# Patient Record
Sex: Female | Born: 1940
Health system: Southern US, Community
[De-identification: ages and names within clinical notes are randomized; demographics above are authoritative.]

## PROBLEM LIST (undated history)

## (undated) DIAGNOSIS — I10 Essential (primary) hypertension: Secondary | ICD-10-CM

## (undated) DIAGNOSIS — F329 Major depressive disorder, single episode, unspecified: Secondary | ICD-10-CM

## (undated) DIAGNOSIS — F32A Depression, unspecified: Secondary | ICD-10-CM

## (undated) DIAGNOSIS — F419 Anxiety disorder, unspecified: Secondary | ICD-10-CM

## (undated) HISTORY — PX: TONSILLECTOMY: SUR1361

---

## 2005-01-19 ENCOUNTER — Encounter: Admission: RE | Admit: 2005-01-19 | Discharge: 2005-01-19 | Payer: Self-pay | Admitting: Geriatric Medicine

## 2007-08-28 ENCOUNTER — Other Ambulatory Visit: Admission: RE | Admit: 2007-08-28 | Discharge: 2007-08-28 | Payer: Self-pay | Admitting: Geriatric Medicine

## 2008-09-02 ENCOUNTER — Other Ambulatory Visit: Admission: RE | Admit: 2008-09-02 | Discharge: 2008-09-02 | Payer: Self-pay | Admitting: Geriatric Medicine

## 2008-09-16 ENCOUNTER — Encounter: Admission: RE | Admit: 2008-09-16 | Discharge: 2008-09-16 | Payer: Self-pay | Admitting: Geriatric Medicine

## 2009-10-08 ENCOUNTER — Other Ambulatory Visit: Admission: RE | Admit: 2009-10-08 | Discharge: 2009-10-08 | Payer: Self-pay | Admitting: Geriatric Medicine

## 2010-11-28 ENCOUNTER — Encounter: Payer: Self-pay | Admitting: Geriatric Medicine

## 2011-10-24 ENCOUNTER — Encounter: Payer: Self-pay | Admitting: *Deleted

## 2011-10-24 ENCOUNTER — Other Ambulatory Visit (HOSPITAL_COMMUNITY): Payer: Medicare Other

## 2011-10-24 ENCOUNTER — Emergency Department (HOSPITAL_COMMUNITY): Payer: Medicare Other

## 2011-10-24 ENCOUNTER — Emergency Department (HOSPITAL_COMMUNITY)
Admission: EM | Admit: 2011-10-24 | Discharge: 2011-10-24 | Disposition: A | Discharge status: Home / Self Care | Payer: Medicare Other | Attending: Emergency Medicine | Admitting: Emergency Medicine

## 2011-10-24 DIAGNOSIS — W010XXA Fall on same level from slipping, tripping and stumbling without subsequent striking against object, initial encounter: Secondary | ICD-10-CM | POA: Insufficient documentation

## 2011-10-24 DIAGNOSIS — Z79899 Other long term (current) drug therapy: Secondary | ICD-10-CM | POA: Insufficient documentation

## 2011-10-24 DIAGNOSIS — S82009A Unspecified fracture of unspecified patella, initial encounter for closed fracture: Secondary | ICD-10-CM | POA: Insufficient documentation

## 2011-10-24 DIAGNOSIS — F341 Dysthymic disorder: Secondary | ICD-10-CM | POA: Insufficient documentation

## 2011-10-24 DIAGNOSIS — M25569 Pain in unspecified knee: Secondary | ICD-10-CM | POA: Insufficient documentation

## 2011-10-24 DIAGNOSIS — IMO0002 Reserved for concepts with insufficient information to code with codable children: Secondary | ICD-10-CM | POA: Insufficient documentation

## 2011-10-24 DIAGNOSIS — E119 Type 2 diabetes mellitus without complications: Secondary | ICD-10-CM | POA: Insufficient documentation

## 2011-10-24 DIAGNOSIS — I10 Essential (primary) hypertension: Secondary | ICD-10-CM | POA: Insufficient documentation

## 2011-10-24 HISTORY — DX: Essential (primary) hypertension: I10

## 2011-10-24 HISTORY — DX: Anxiety disorder, unspecified: F41.9

## 2011-10-24 HISTORY — DX: Major depressive disorder, single episode, unspecified: F32.9

## 2011-10-24 HISTORY — DX: Depression, unspecified: F32.A

## 2011-10-24 NOTE — ED Notes (Signed)
Per EMS, staff called from [facility] after pt's tennis shoes "got stuck" on the carpet causing pt to fall on R knee, pt observed walking on knee by EMS after fall.

## 2011-10-24 NOTE — ED Notes (Signed)
Patient returned from X-ray 

## 2011-10-24 NOTE — ED Notes (Signed)
EAV:[id number]<JX> Expected date:10/24/11<BR> Expected time: 7:36 AM<BR> Means of arrival:<BR> Comments:<BR> EMS, Fall-knee injury

## 2011-10-24 NOTE — ED Notes (Addendum)
[facility] called and report given to Elease Hashimoto, Merchandiser, retail.

## 2011-10-24 NOTE — ED Notes (Signed)
Patient transported to X-ray 

## 2011-10-24 NOTE — ED Provider Notes (Signed)
History     CSN: [id number] Arrival date & time: 10/24/2011  7:55 AM   Chief Complaint  Patient presents with  . Fall   HPI Pt was seen at 0810.  Per pt, c/o sudden onset and resolution of one episode of trip and fall PTA.  Pt states she fell onto her right knee.  Was ambulatory after the fall.  Denies hitting head, no LOC, no neck or back pain, no CP/SOB, no abd pain, no prodromal symptoms before fall, no focal motor weakness, no tingling/numbness in extremities.   Past Medical History  Diagnosis Date  . Diabetes mellitus   . Hypertension   . Anxiety   . Depression     Past Surgical History  Procedure Date  . Tonsillectomy    History  Substance Use Topics  . Smoking status: Never Smoker   . Smokeless tobacco: Never Used  . Alcohol Use: No    Review of Systems ROS: Statement: All systems negative except as marked or noted in the HPI; Constitutional: Negative for fever and chills. ; ; Eyes: Negative for eye pain, redness and discharge. ; ; ENMT: Negative for ear pain, hoarseness, nasal congestion, sinus pressure and sore throat. ; ; Cardiovascular: Negative for chest pain, palpitations, diaphoresis, dyspnea and peripheral edema. ; ; Respiratory: Negative for cough, wheezing and stridor. ; ; Gastrointestinal: Negative for nausea, vomiting, diarrhea, abdominal pain, blood in stool, hematemesis, jaundice and rectal bleeding. . ; ; Genitourinary: Negative for dysuria, flank pain and hematuria. ; ; Musculoskeletal: Negative for back pain and neck pain. +right knee pain ; Skin: +superficial abrasion. Negative for pruritus, rash, blisters, bruising and skin lesion.; ; Neuro: Negative for headache, lightheadedness and neck stiffness. Negative for weakness, altered level of consciousness , altered mental status, extremity weakness, paresthesias, involuntary movement, seizure and syncope.     Allergies  Review of patient's allergies indicates no known allergies.  Home Medications    Current Outpatient Rx  Name Route Sig Dispense Refill  . ENALAPRIL MALEATE 5 MG PO TABS Oral Take 5 mg by mouth daily.      . FUROSEMIDE 20 MG PO TABS Oral Take 20 mg by mouth daily.      Marland Kitchen LABETALOL HCL 100 MG PO TABS Oral Take 100 mg by mouth 2 (two) times daily.      Marland Kitchen METFORMIN HCL 850 MG PO TABS Oral Take 850 mg by mouth 2 (two) times daily with a meal.      . SIMVASTATIN 20 MG PO TABS Oral Take 20 mg by mouth 2 (two) times daily.        BP 130/75  Pulse 83  Temp(Src) 99 F (37.2 C) (Oral)  Resp 16  SpO2 98%  Physical Exam 0815: Physical examination:  Nursing notes reviewed; Vital signs and O2 SAT reviewed;  Constitutional: Well developed, Well nourished, Well hydrated, In no acute distress; Head:  Normocephalic, atraumatic; Eyes: EOMI, PERRL, No scleral icterus; ENMT: Mouth and pharynx normal, Mucous membranes moist; Neck: Supple, Full range of motion, No lymphadenopathy; Cardiovascular: Regular rate and rhythm, No murmur, rub, or gallop; Respiratory: Breath sounds clear & equal bilaterally, No rales, rhonchi, wheezes, or rub, Normal respiratory effort/excursion; Chest: Nontender, Movement normal; Abdomen: Soft, Nontender, Nondistended, Normal bowel sounds; Spine:  No midline CS, TS, LS tenderness. Extremities: Pulses normal, Pelvis stable. +right knee with small superficial abrasion to patella area with localized faint ecchymosis and edema, no deformity, +decreased ROM (F/E) right knee due to increasing pain, pt is able  to lift extended RLE off stretcher, and extend right lower leg from flexion.  No ligamentous laxity.  No palpable patellar or quad tendon step-offs.  NMS intact right foot, strong pedal pp.  No proximal fibular head tenderness.  No specific area of point tenderness. RLE muscle compartments soft. No calf edema or asymmetry.; Neuro: AA&Ox3, Major CN grossly intact. Speech clear, no facial droop.  No gross focal motor or sensory deficits in extremities.; Skin: Color normal,  Warm, Dry.   ED Course  Procedures  MDM  MDM Reviewed: vitals, nursing note and previous chart Interpretation: x-ray   Ct Knee Right Wo Contrast  10/24/2011  *RADIOLOGY REPORT*  Clinical Data: Evaluate for knee fracture  CT OF THE RIGHT KNEE WITHOUT CONTRAST  Technique:  Multidetector CT imaging was performed according to the standard protocol. Multiplanar CT image reconstructions were also generated.  Comparison: 10/24/2011  Findings:  There is a nondisplaced fracture which extends through the superolateral margin of the patella.  There are no additional fractures or subluxations identified.  A moderate sized suprapatellar joint effusion is identified.  There are mild degenerative changes involving the knee joint including sharpening of the tibial spines.  There are no radio-opaque foreign body or soft tissue calcifications.  IMPRESSION:  1. Nondisplaced fracture involving the superior lateral margin of the patella.  Original Report Authenticated By: Rosealee Albee, M.D.   Dg Knee Complete 4 Views Right  10/24/2011  *RADIOLOGY REPORT*  Clinical Data: Post fall, now with knee pain  RIGHT KNEE - COMPLETE 4+ VIEW  Comparison: None.  Findings: There is a serpiginous lucency within the superior lateral aspect of the patella.  No dislocation.  Small knee joint effusion.  There is minimal degenerative changes of the medial compartment of patellofemoral joint with joint space loss and osteophytosis.  No definite evidence of chondrocalcinosis. Vascular calcifications.  IMPRESSION:  Small joint effusion with serpiginous lucency within the superior lateral aspect of the patella which may represent either a nondisplaced avulsion fracture versus an unfused apophysis. Correlation for point tenderness at this location is recommended.  Original Report Authenticated By: Waynard Reeds, M.D.    1045:  T/C to Ortho Dr. Turner Daniels, case discussed, including:  HPI, pertinent PM/SHx, VS/PE, dx testing, ED course and  treatment.  Has reviewed the XR/CT images.  Agreeable to f/u in ofc in the next 1-2 weeks.  Requests to place in knee immobilizer, walker with WBAT.   10:48 AM:   Knee immobilizer placed, given walker.  Dx testing d/w pt and family.  Questions answered.  Verb understanding, agreeable to d/c home with outpt f/u.   Daja Shuping Allison Quarry, DO 10/24/11 2026

## 2011-10-24 NOTE — ED Notes (Signed)
Dr. McMannus at bedside 

## 2014-01-30 ENCOUNTER — Other Ambulatory Visit: Payer: Self-pay

## 2014-01-30 DIAGNOSIS — Z1231 Encounter for screening mammogram for malignant neoplasm of breast: Secondary | ICD-10-CM

## 2014-02-27 ENCOUNTER — Ambulatory Visit
Admission: RE | Admit: 2014-02-27 | Discharge: 2014-02-27 | Disposition: A | Discharge status: Home / Self Care | Payer: Self-pay | Source: Ambulatory Visit

## 2014-02-27 ENCOUNTER — Encounter (INDEPENDENT_AMBULATORY_CARE_PROVIDER_SITE_OTHER): Payer: Self-pay

## 2014-02-27 DIAGNOSIS — Z1231 Encounter for screening mammogram for malignant neoplasm of breast: Secondary | ICD-10-CM

## 2014-05-24 ENCOUNTER — Other Ambulatory Visit: Payer: Self-pay | Admitting: Internal Medicine

## 2014-05-24 ENCOUNTER — Ambulatory Visit
Admission: RE | Admit: 2014-05-24 | Discharge: 2014-05-24 | Disposition: A | Discharge status: Home / Self Care | Payer: Medicaid Other | Source: Ambulatory Visit | Attending: Internal Medicine | Admitting: Internal Medicine

## 2014-05-24 DIAGNOSIS — M7989 Other specified soft tissue disorders: Secondary | ICD-10-CM

## 2015-01-27 ENCOUNTER — Other Ambulatory Visit: Payer: Self-pay | Admitting: Geriatric Medicine

## 2015-01-27 DIAGNOSIS — Z1231 Encounter for screening mammogram for malignant neoplasm of breast: Secondary | ICD-10-CM

## 2015-03-12 ENCOUNTER — Ambulatory Visit: Payer: Medicaid Other

## 2015-03-19 ENCOUNTER — Ambulatory Visit
Admission: RE | Admit: 2015-03-19 | Discharge: 2015-03-19 | Disposition: A | Discharge status: Home / Self Care | Payer: Medicare Other | Source: Ambulatory Visit | Attending: Geriatric Medicine | Admitting: Geriatric Medicine

## 2015-03-19 DIAGNOSIS — Z1231 Encounter for screening mammogram for malignant neoplasm of breast: Secondary | ICD-10-CM

## 2016-02-11 DIAGNOSIS — N183 Chronic kidney disease, stage 3 (moderate): Secondary | ICD-10-CM | POA: Diagnosis not present

## 2016-02-11 DIAGNOSIS — Z7984 Long term (current) use of oral hypoglycemic drugs: Secondary | ICD-10-CM | POA: Diagnosis not present

## 2016-02-11 DIAGNOSIS — Z0000 Encounter for general adult medical examination without abnormal findings: Secondary | ICD-10-CM | POA: Diagnosis not present

## 2016-02-11 DIAGNOSIS — I10 Essential (primary) hypertension: Secondary | ICD-10-CM | POA: Diagnosis not present

## 2016-02-11 DIAGNOSIS — Z1389 Encounter for screening for other disorder: Secondary | ICD-10-CM | POA: Diagnosis not present

## 2016-02-11 DIAGNOSIS — E7800 Pure hypercholesterolemia, unspecified: Secondary | ICD-10-CM | POA: Diagnosis not present

## 2016-02-11 DIAGNOSIS — Z79899 Other long term (current) drug therapy: Secondary | ICD-10-CM | POA: Diagnosis not present

## 2016-02-11 DIAGNOSIS — E1121 Type 2 diabetes mellitus with diabetic nephropathy: Secondary | ICD-10-CM | POA: Diagnosis not present

## 2016-02-11 DIAGNOSIS — F325 Major depressive disorder, single episode, in full remission: Secondary | ICD-10-CM | POA: Diagnosis not present

## 2016-02-11 DIAGNOSIS — M810 Age-related osteoporosis without current pathological fracture: Secondary | ICD-10-CM | POA: Diagnosis not present

## 2016-02-12 ENCOUNTER — Other Ambulatory Visit: Payer: Self-pay | Admitting: Geriatric Medicine

## 2016-02-12 DIAGNOSIS — Z1231 Encounter for screening mammogram for malignant neoplasm of breast: Secondary | ICD-10-CM

## 2016-03-24 ENCOUNTER — Ambulatory Visit
Admission: RE | Admit: 2016-03-24 | Discharge: 2016-03-24 | Disposition: A | Discharge status: Home / Self Care | Payer: Medicare Other | Source: Ambulatory Visit | Attending: Geriatric Medicine | Admitting: Geriatric Medicine

## 2016-03-24 DIAGNOSIS — Z1231 Encounter for screening mammogram for malignant neoplasm of breast: Secondary | ICD-10-CM

## 2016-07-28 DIAGNOSIS — E119 Type 2 diabetes mellitus without complications: Secondary | ICD-10-CM | POA: Diagnosis not present

## 2016-07-28 DIAGNOSIS — H2513 Age-related nuclear cataract, bilateral: Secondary | ICD-10-CM | POA: Diagnosis not present

## 2016-08-11 DIAGNOSIS — I10 Essential (primary) hypertension: Secondary | ICD-10-CM | POA: Diagnosis not present

## 2016-08-11 DIAGNOSIS — Z7984 Long term (current) use of oral hypoglycemic drugs: Secondary | ICD-10-CM | POA: Diagnosis not present

## 2016-08-11 DIAGNOSIS — Z79899 Other long term (current) drug therapy: Secondary | ICD-10-CM | POA: Diagnosis not present

## 2016-08-11 DIAGNOSIS — E119 Type 2 diabetes mellitus without complications: Secondary | ICD-10-CM | POA: Diagnosis not present

## 2016-08-11 DIAGNOSIS — Z23 Encounter for immunization: Secondary | ICD-10-CM | POA: Diagnosis not present

## 2017-01-05 ENCOUNTER — Other Ambulatory Visit: Payer: Self-pay | Admitting: Geriatric Medicine

## 2017-01-05 DIAGNOSIS — Z1231 Encounter for screening mammogram for malignant neoplasm of breast: Secondary | ICD-10-CM

## 2017-02-09 DIAGNOSIS — I10 Essential (primary) hypertension: Secondary | ICD-10-CM | POA: Diagnosis not present

## 2017-02-09 DIAGNOSIS — Z79899 Other long term (current) drug therapy: Secondary | ICD-10-CM | POA: Diagnosis not present

## 2017-02-09 DIAGNOSIS — E119 Type 2 diabetes mellitus without complications: Secondary | ICD-10-CM | POA: Diagnosis not present

## 2017-02-09 DIAGNOSIS — E7800 Pure hypercholesterolemia, unspecified: Secondary | ICD-10-CM | POA: Diagnosis not present

## 2017-03-28 ENCOUNTER — Ambulatory Visit
Admission: RE | Admit: 2017-03-28 | Discharge: 2017-03-28 | Disposition: A | Discharge status: Home / Self Care | Payer: Medicare Other | Source: Ambulatory Visit | Attending: Geriatric Medicine | Admitting: Geriatric Medicine

## 2017-03-28 DIAGNOSIS — Z1231 Encounter for screening mammogram for malignant neoplasm of breast: Secondary | ICD-10-CM

## 2017-08-10 DIAGNOSIS — H2513 Age-related nuclear cataract, bilateral: Secondary | ICD-10-CM | POA: Diagnosis not present

## 2017-08-10 DIAGNOSIS — E119 Type 2 diabetes mellitus without complications: Secondary | ICD-10-CM | POA: Diagnosis not present

## 2017-08-24 DIAGNOSIS — E119 Type 2 diabetes mellitus without complications: Secondary | ICD-10-CM | POA: Diagnosis not present

## 2017-08-24 DIAGNOSIS — I10 Essential (primary) hypertension: Secondary | ICD-10-CM | POA: Diagnosis not present

## 2017-08-24 DIAGNOSIS — F325 Major depressive disorder, single episode, in full remission: Secondary | ICD-10-CM | POA: Diagnosis not present

## 2017-08-24 DIAGNOSIS — Z0000 Encounter for general adult medical examination without abnormal findings: Secondary | ICD-10-CM | POA: Diagnosis not present

## 2017-11-01 ENCOUNTER — Other Ambulatory Visit: Payer: Self-pay

## 2017-11-01 ENCOUNTER — Inpatient Hospital Stay (HOSPITAL_COMMUNITY): Payer: Medicare Other | Admitting: Anesthesiology

## 2017-11-01 ENCOUNTER — Encounter (HOSPITAL_COMMUNITY): Payer: Self-pay | Admitting: *Deleted

## 2017-11-01 ENCOUNTER — Encounter (HOSPITAL_COMMUNITY)
Admission: EM | Disposition: A | Discharge status: Skilled Nursing Facility | Payer: Self-pay | Source: Home / Self Care | Attending: Internal Medicine

## 2017-11-01 ENCOUNTER — Emergency Department (HOSPITAL_COMMUNITY): Payer: Medicare Other

## 2017-11-01 ENCOUNTER — Inpatient Hospital Stay: Admit: 2017-11-01 | Payer: Medicare Other | Admitting: Orthopedic Surgery

## 2017-11-01 ENCOUNTER — Inpatient Hospital Stay (HOSPITAL_COMMUNITY): Payer: Medicare Other

## 2017-11-01 ENCOUNTER — Inpatient Hospital Stay (HOSPITAL_COMMUNITY)
Admission: EM | Admit: 2017-11-01 | Discharge: 2017-11-04 | DRG: 481 | Disposition: A | Discharge status: Skilled Nursing Facility | Payer: Medicare Other | Attending: Internal Medicine | Admitting: Internal Medicine

## 2017-11-01 DIAGNOSIS — R7303 Prediabetes: Secondary | ICD-10-CM | POA: Diagnosis not present

## 2017-11-01 DIAGNOSIS — R262 Difficulty in walking, not elsewhere classified: Secondary | ICD-10-CM | POA: Diagnosis not present

## 2017-11-01 DIAGNOSIS — S72141A Displaced intertrochanteric fracture of right femur, initial encounter for closed fracture: Secondary | ICD-10-CM | POA: Diagnosis not present

## 2017-11-01 DIAGNOSIS — F0390 Unspecified dementia without behavioral disturbance: Secondary | ICD-10-CM | POA: Diagnosis not present

## 2017-11-01 DIAGNOSIS — D62 Acute posthemorrhagic anemia: Secondary | ICD-10-CM | POA: Diagnosis not present

## 2017-11-01 DIAGNOSIS — F329 Major depressive disorder, single episode, unspecified: Secondary | ICD-10-CM | POA: Diagnosis present

## 2017-11-01 DIAGNOSIS — M6281 Muscle weakness (generalized): Secondary | ICD-10-CM | POA: Diagnosis not present

## 2017-11-01 DIAGNOSIS — T148XXA Other injury of unspecified body region, initial encounter: Secondary | ICD-10-CM | POA: Diagnosis not present

## 2017-11-01 DIAGNOSIS — W19XXXD Unspecified fall, subsequent encounter: Secondary | ICD-10-CM | POA: Diagnosis not present

## 2017-11-01 DIAGNOSIS — E785 Hyperlipidemia, unspecified: Secondary | ICD-10-CM | POA: Diagnosis not present

## 2017-11-01 DIAGNOSIS — Z4789 Encounter for other orthopedic aftercare: Secondary | ICD-10-CM | POA: Diagnosis not present

## 2017-11-01 DIAGNOSIS — R4189 Other symptoms and signs involving cognitive functions and awareness: Secondary | ICD-10-CM | POA: Diagnosis not present

## 2017-11-01 DIAGNOSIS — S7221XA Displaced subtrochanteric fracture of right femur, initial encounter for closed fracture: Secondary | ICD-10-CM | POA: Diagnosis not present

## 2017-11-01 DIAGNOSIS — E119 Type 2 diabetes mellitus without complications: Secondary | ICD-10-CM | POA: Diagnosis present

## 2017-11-01 DIAGNOSIS — S72009S Fracture of unspecified part of neck of unspecified femur, sequela: Secondary | ICD-10-CM

## 2017-11-01 DIAGNOSIS — S72001A Fracture of unspecified part of neck of right femur, initial encounter for closed fracture: Secondary | ICD-10-CM | POA: Diagnosis not present

## 2017-11-01 DIAGNOSIS — E039 Hypothyroidism, unspecified: Secondary | ICD-10-CM | POA: Diagnosis not present

## 2017-11-01 DIAGNOSIS — S72009A Fracture of unspecified part of neck of unspecified femur, initial encounter for closed fracture: Secondary | ICD-10-CM

## 2017-11-01 DIAGNOSIS — S79929A Unspecified injury of unspecified thigh, initial encounter: Secondary | ICD-10-CM | POA: Diagnosis not present

## 2017-11-01 DIAGNOSIS — W010XXA Fall on same level from slipping, tripping and stumbling without subsequent striking against object, initial encounter: Secondary | ICD-10-CM | POA: Diagnosis present

## 2017-11-01 DIAGNOSIS — I10 Essential (primary) hypertension: Secondary | ICD-10-CM | POA: Diagnosis not present

## 2017-11-01 DIAGNOSIS — Z7984 Long term (current) use of oral hypoglycemic drugs: Secondary | ICD-10-CM | POA: Diagnosis not present

## 2017-11-01 DIAGNOSIS — E569 Vitamin deficiency, unspecified: Secondary | ICD-10-CM | POA: Diagnosis not present

## 2017-11-01 DIAGNOSIS — D649 Anemia, unspecified: Secondary | ICD-10-CM | POA: Diagnosis present

## 2017-11-01 DIAGNOSIS — Y92129 Unspecified place in nursing home as the place of occurrence of the external cause: Secondary | ICD-10-CM

## 2017-11-01 DIAGNOSIS — Z111 Encounter for screening for respiratory tuberculosis: Secondary | ICD-10-CM | POA: Diagnosis not present

## 2017-11-01 DIAGNOSIS — E1122 Type 2 diabetes mellitus with diabetic chronic kidney disease: Secondary | ICD-10-CM

## 2017-11-01 DIAGNOSIS — Z79899 Other long term (current) drug therapy: Secondary | ICD-10-CM

## 2017-11-01 DIAGNOSIS — F419 Anxiety disorder, unspecified: Secondary | ICD-10-CM | POA: Diagnosis present

## 2017-11-01 DIAGNOSIS — K5900 Constipation, unspecified: Secondary | ICD-10-CM | POA: Diagnosis not present

## 2017-11-01 DIAGNOSIS — W19XXXA Unspecified fall, initial encounter: Secondary | ICD-10-CM | POA: Diagnosis present

## 2017-11-01 DIAGNOSIS — R269 Unspecified abnormalities of gait and mobility: Secondary | ICD-10-CM | POA: Diagnosis present

## 2017-11-01 DIAGNOSIS — R9431 Abnormal electrocardiogram [ECG] [EKG]: Secondary | ICD-10-CM | POA: Diagnosis not present

## 2017-11-01 DIAGNOSIS — R2689 Other abnormalities of gait and mobility: Secondary | ICD-10-CM | POA: Diagnosis not present

## 2017-11-01 DIAGNOSIS — S72141D Displaced intertrochanteric fracture of right femur, subsequent encounter for closed fracture with routine healing: Secondary | ICD-10-CM | POA: Diagnosis not present

## 2017-11-01 DIAGNOSIS — G8911 Acute pain due to trauma: Secondary | ICD-10-CM | POA: Diagnosis not present

## 2017-11-01 DIAGNOSIS — R52 Pain, unspecified: Secondary | ICD-10-CM | POA: Diagnosis not present

## 2017-11-01 DIAGNOSIS — S2231XA Fracture of one rib, right side, initial encounter for closed fracture: Secondary | ICD-10-CM | POA: Diagnosis not present

## 2017-11-01 DIAGNOSIS — R279 Unspecified lack of coordination: Secondary | ICD-10-CM | POA: Diagnosis not present

## 2017-11-01 DIAGNOSIS — Z9181 History of falling: Secondary | ICD-10-CM | POA: Diagnosis not present

## 2017-11-01 HISTORY — PX: INTRAMEDULLARY (IM) NAIL INTERTROCHANTERIC: SHX5875

## 2017-11-01 LAB — CBC WITH DIFFERENTIAL/PLATELET
BASOS PCT: 0 %
Basophils Absolute: 0 10*3/uL (ref 0.0–0.1)
EOS ABS: 0.1 10*3/uL (ref 0.0–0.7)
EOS PCT: 1 %
HCT: 30.9 % — ABNORMAL LOW (ref 36.0–46.0)
Hemoglobin: 10.2 g/dL — ABNORMAL LOW (ref 12.0–15.0)
LYMPHS ABS: 0.8 10*3/uL (ref 0.7–4.0)
Lymphocytes Relative: 7 %
MCH: 30.4 pg (ref 26.0–34.0)
MCHC: 33 g/dL (ref 30.0–36.0)
MCV: 92.2 fL (ref 78.0–100.0)
MONOS PCT: 11 %
Monocytes Absolute: 1.4 10*3/uL — ABNORMAL HIGH (ref 0.1–1.0)
NEUTROS PCT: 81 %
Neutro Abs: 10.4 10*3/uL — ABNORMAL HIGH (ref 1.7–7.7)
PLATELETS: 228 10*3/uL (ref 150–400)
RBC: 3.35 MIL/uL — ABNORMAL LOW (ref 3.87–5.11)
RDW: 13.3 % (ref 11.5–15.5)
WBC: 12.7 10*3/uL — AB (ref 4.0–10.5)

## 2017-11-01 LAB — BASIC METABOLIC PANEL
Anion gap: 7 (ref 5–15)
BUN: 30 mg/dL — AB (ref 6–20)
CO2: 24 mmol/L (ref 22–32)
CREATININE: 0.87 mg/dL (ref 0.44–1.00)
Calcium: 9.1 mg/dL (ref 8.9–10.3)
Chloride: 110 mmol/L (ref 101–111)
GFR calc non Af Amer: >60 mL/min (ref >60)
Glucose, Bld: 170 mg/dL — ABNORMAL HIGH (ref 65–99)
Potassium: 4.7 mmol/L (ref 3.5–5.1)
SODIUM: 141 mmol/L (ref 135–145)

## 2017-11-01 LAB — PROTIME-INR
INR: 1.1
Prothrombin Time: 14.2 seconds (ref 11.4–15.2)

## 2017-11-01 LAB — GLUCOSE, CAPILLARY
GLUCOSE-CAPILLARY: 149 mg/dL — AB (ref 65–99)
GLUCOSE-CAPILLARY: 184 mg/dL — AB (ref 65–99)
GLUCOSE-CAPILLARY: 143 mg/dL — AB (ref 65–99)
Glucose-Capillary: 253 mg/dL — ABNORMAL HIGH (ref 65–99)

## 2017-11-01 LAB — ABO/RH: ABO/RH(D): A POS

## 2017-11-01 LAB — HEMOGLOBIN A1C
HEMOGLOBIN A1C: 7.5 % — AB (ref 4.8–5.6)
MEAN PLASMA GLUCOSE: 168.55 mg/dL

## 2017-11-01 LAB — MAGNESIUM: Magnesium: 1.6 mg/dL — ABNORMAL LOW (ref 1.7–2.4)

## 2017-11-01 SURGERY — FIXATION, FRACTURE, INTERTROCHANTERIC, WITH INTRAMEDULLARY ROD
Anesthesia: General | Site: Hip | Laterality: Right

## 2017-11-01 MED ORDER — HYDROCODONE-ACETAMINOPHEN 5-325 MG PO TABS: 1.0000 | ORAL_TABLET | Freq: Four times a day (QID) | ORAL | 0 refills | Status: AC | PRN

## 2017-11-01 MED ORDER — SUGAMMADEX SODIUM 200 MG/2ML IV SOLN
INTRAVENOUS | Status: DC | PRN
  Administered 2017-11-01: 150 mg via INTRAVENOUS

## 2017-11-01 MED ORDER — LIDOCAINE 2% (20 MG/ML) 5 ML SYRINGE
INTRAMUSCULAR | Status: DC | PRN
  Administered 2017-11-01: 50 mg via INTRAVENOUS

## 2017-11-01 MED ORDER — FENTANYL CITRATE (PF) 100 MCG/2ML IJ SOLN
INTRAMUSCULAR | Status: DC | PRN
  Administered 2017-11-01: 50 ug via INTRAVENOUS

## 2017-11-01 MED ORDER — METHOCARBAMOL 1000 MG/10ML IJ SOLN: 500.0000 mg | Freq: Four times a day (QID) | INTRAMUSCULAR | Status: DC | PRN

## 2017-11-01 MED ORDER — MORPHINE SULFATE (PF) 2 MG/ML IV SOLN: 0.5000 mg | INTRAVENOUS | Status: DC | PRN

## 2017-11-01 MED ORDER — ASPIRIN EC 81 MG PO TBEC: 81.0000 mg | DELAYED_RELEASE_TABLET | Freq: Two times a day (BID) | ORAL | 0 refills | Status: AC

## 2017-11-01 MED ORDER — 0.9 % SODIUM CHLORIDE (POUR BTL) OPTIME
TOPICAL | Status: DC | PRN
  Administered 2017-11-01: 1000 mL

## 2017-11-01 MED ORDER — METOCLOPRAMIDE HCL 5 MG PO TABS: 5.0000 mg | ORAL_TABLET | Freq: Three times a day (TID) | ORAL | Status: DC | PRN

## 2017-11-01 MED ORDER — FENTANYL CITRATE (PF) 100 MCG/2ML IJ SOLN: 50.0000 ug | Freq: Once | INTRAMUSCULAR | Status: DC

## 2017-11-01 MED ORDER — SIMVASTATIN 10 MG PO TABS
10.0000 mg | ORAL_TABLET | Freq: Every day | ORAL | Status: DC
  Administered 2017-11-01 – 2017-11-03 (×3): 10 mg via ORAL
  Filled 2017-11-01 (×3): qty 1

## 2017-11-01 MED ORDER — ENALAPRIL MALEATE 5 MG PO TABS
5.0000 mg | ORAL_TABLET | Freq: Every day | ORAL | Status: DC
  Administered 2017-11-01 – 2017-11-04 (×3): 5 mg via ORAL
  Filled 2017-11-01 (×4): qty 1

## 2017-11-01 MED ORDER — FUROSEMIDE 20 MG PO TABS
20.0000 mg | ORAL_TABLET | Freq: Every day | ORAL | Status: DC
  Administered 2017-11-01 – 2017-11-04 (×3): 20 mg via ORAL
  Filled 2017-11-01 (×4): qty 1

## 2017-11-01 MED ORDER — HYDROMORPHONE HCL 1 MG/ML IJ SOLN
INTRAMUSCULAR | Status: AC
  Filled 2017-11-01: qty 1

## 2017-11-01 MED ORDER — SODIUM CHLORIDE 0.9 % IV SOLN
INTRAVENOUS | Status: DC | PRN
  Administered 2017-11-01: 10:00:00 via INTRAVENOUS

## 2017-11-01 MED ORDER — MEPERIDINE HCL 50 MG/ML IJ SOLN: 6.2500 mg | INTRAMUSCULAR | Status: DC | PRN

## 2017-11-01 MED ORDER — SODIUM CHLORIDE 0.9 % IV SOLN
Freq: Once | INTRAVENOUS | Status: AC
  Administered 2017-11-01: 10:00:00 via INTRAVENOUS

## 2017-11-01 MED ORDER — ACETAMINOPHEN 325 MG PO TABS
650.0000 mg | ORAL_TABLET | ORAL | Status: DC | PRN
  Administered 2017-11-02: 650 mg via ORAL
  Filled 2017-11-01: qty 2

## 2017-11-01 MED ORDER — LABETALOL HCL 100 MG PO TABS
100.0000 mg | ORAL_TABLET | Freq: Two times a day (BID) | ORAL | Status: DC
  Administered 2017-11-01 – 2017-11-04 (×4): 100 mg via ORAL
  Filled 2017-11-01 (×5): qty 1

## 2017-11-01 MED ORDER — ACETAMINOPHEN 650 MG RE SUPP: 650.0000 mg | RECTAL | Status: DC | PRN

## 2017-11-01 MED ORDER — ONDANSETRON HCL 4 MG/2ML IJ SOLN
INTRAMUSCULAR | Status: DC | PRN
  Administered 2017-11-01: 4 mg via INTRAVENOUS

## 2017-11-01 MED ORDER — ONDANSETRON HCL 4 MG/2ML IJ SOLN: 4.0000 mg | Freq: Four times a day (QID) | INTRAMUSCULAR | Status: DC | PRN

## 2017-11-01 MED ORDER — HYDROMORPHONE HCL 1 MG/ML IJ SOLN
0.2500 mg | INTRAMUSCULAR | Status: DC | PRN
  Administered 2017-11-01: 0.5 mg via INTRAVENOUS

## 2017-11-01 MED ORDER — SODIUM CHLORIDE 0.9 % IV SOLN
INTRAVENOUS | Status: DC
  Administered 2017-11-01: 14:00:00 via INTRAVENOUS

## 2017-11-01 MED ORDER — HYDROCODONE-ACETAMINOPHEN 5-325 MG PO TABS
1.0000 | ORAL_TABLET | Freq: Four times a day (QID) | ORAL | Status: DC | PRN
  Administered 2017-11-01 – 2017-11-03 (×5): 1 via ORAL
  Filled 2017-11-01 (×6): qty 1

## 2017-11-01 MED ORDER — LIP MEDEX EX OINT
TOPICAL_OINTMENT | CUTANEOUS | Status: AC
  Filled 2017-11-01: qty 7

## 2017-11-01 MED ORDER — METOCLOPRAMIDE HCL 5 MG/ML IJ SOLN: 5.0000 mg | Freq: Three times a day (TID) | INTRAMUSCULAR | Status: DC | PRN

## 2017-11-01 MED ORDER — ROCURONIUM BROMIDE 10 MG/ML (PF) SYRINGE
PREFILLED_SYRINGE | INTRAVENOUS | Status: DC | PRN
  Administered 2017-11-01: 50 mg via INTRAVENOUS

## 2017-11-01 MED ORDER — CEFAZOLIN SODIUM-DEXTROSE 1-4 GM/50ML-% IV SOLN
1.0000 g | Freq: Four times a day (QID) | INTRAVENOUS | Status: AC
  Administered 2017-11-01 – 2017-11-02 (×3): 1 g via INTRAVENOUS
  Filled 2017-11-01 (×3): qty 50

## 2017-11-01 MED ORDER — PROPOFOL 10 MG/ML IV BOLUS
INTRAVENOUS | Status: AC
  Filled 2017-11-01: qty 20

## 2017-11-01 MED ORDER — CEFAZOLIN SODIUM-DEXTROSE 2-4 GM/100ML-% IV SOLN
2.0000 g | INTRAVENOUS | Status: AC
  Administered 2017-11-01: 2 g via INTRAVENOUS

## 2017-11-01 MED ORDER — HYDROCODONE-ACETAMINOPHEN 5-325 MG PO TABS: 2.0000 | ORAL_TABLET | ORAL | Status: DC | PRN

## 2017-11-01 MED ORDER — PHENYLEPHRINE 40 MCG/ML (10ML) SYRINGE FOR IV PUSH (FOR BLOOD PRESSURE SUPPORT)
PREFILLED_SYRINGE | INTRAVENOUS | Status: DC | PRN
  Administered 2017-11-01: 80 ug via INTRAVENOUS
  Administered 2017-11-01: 40 ug via INTRAVENOUS

## 2017-11-01 MED ORDER — SIMVASTATIN 20 MG PO TABS: 20.0000 mg | ORAL_TABLET | Freq: Two times a day (BID) | ORAL | Status: DC

## 2017-11-01 MED ORDER — FENTANYL CITRATE (PF) 100 MCG/2ML IJ SOLN
INTRAMUSCULAR | Status: AC
  Filled 2017-11-01: qty 2

## 2017-11-01 MED ORDER — PHENYLEPHRINE HCL 10 MG/ML IJ SOLN
INTRAMUSCULAR | Status: DC | PRN
  Administered 2017-11-01: 40 ug/min via INTRAVENOUS

## 2017-11-01 MED ORDER — INSULIN ASPART 100 UNIT/ML ~~LOC~~ SOLN
0.0000 [IU] | SUBCUTANEOUS | Status: DC
  Administered 2017-11-01: 1 [IU] via SUBCUTANEOUS
  Administered 2017-11-01: 5 [IU] via SUBCUTANEOUS
  Administered 2017-11-01 – 2017-11-02 (×2): 2 [IU] via SUBCUTANEOUS
  Administered 2017-11-02: 1 [IU] via SUBCUTANEOUS
  Administered 2017-11-02: 2 [IU] via SUBCUTANEOUS
  Administered 2017-11-02: 1 [IU] via SUBCUTANEOUS
  Administered 2017-11-02: 2 [IU] via SUBCUTANEOUS
  Administered 2017-11-03 (×2): 1 [IU] via SUBCUTANEOUS
  Administered 2017-11-03 (×2): 2 [IU] via SUBCUTANEOUS
  Administered 2017-11-03: 3 [IU] via SUBCUTANEOUS
  Administered 2017-11-04 (×4): 1 [IU] via SUBCUTANEOUS

## 2017-11-01 MED ORDER — METHOCARBAMOL 500 MG PO TABS
500.0000 mg | ORAL_TABLET | Freq: Four times a day (QID) | ORAL | Status: DC | PRN
  Administered 2017-11-01 – 2017-11-02 (×3): 500 mg via ORAL
  Filled 2017-11-01 (×3): qty 1

## 2017-11-01 MED ORDER — HYDROCODONE-ACETAMINOPHEN 5-325 MG PO TABS: 1.0000 | ORAL_TABLET | ORAL | Status: DC | PRN

## 2017-11-01 MED ORDER — METHOCARBAMOL 1000 MG/10ML IJ SOLN
500.0000 mg | Freq: Four times a day (QID) | INTRAMUSCULAR | Status: DC | PRN
  Filled 2017-11-01: qty 5

## 2017-11-01 MED ORDER — SENNA 8.6 MG PO TABS
1.0000 | ORAL_TABLET | Freq: Two times a day (BID) | ORAL | Status: DC
  Administered 2017-11-01 – 2017-11-04 (×6): 8.6 mg via ORAL
  Filled 2017-11-01 (×6): qty 1

## 2017-11-01 MED ORDER — METHOCARBAMOL 500 MG PO TABS: 500.0000 mg | ORAL_TABLET | Freq: Four times a day (QID) | ORAL | Status: DC | PRN

## 2017-11-01 MED ORDER — ASPIRIN EC 81 MG PO TBEC
81.0000 mg | DELAYED_RELEASE_TABLET | Freq: Two times a day (BID) | ORAL | Status: DC
  Administered 2017-11-01 – 2017-11-04 (×6): 81 mg via ORAL
  Filled 2017-11-01 (×6): qty 1

## 2017-11-01 MED ORDER — PROPOFOL 10 MG/ML IV BOLUS
INTRAVENOUS | Status: DC | PRN
  Administered 2017-11-01: 100 mg via INTRAVENOUS

## 2017-11-01 MED ORDER — MORPHINE SULFATE (PF) 2 MG/ML IV SOLN: 1.0000 mg | INTRAVENOUS | Status: DC | PRN

## 2017-11-01 MED ORDER — CEFAZOLIN SODIUM-DEXTROSE 2-4 GM/100ML-% IV SOLN
INTRAVENOUS | Status: AC
  Filled 2017-11-01: qty 100

## 2017-11-01 MED ORDER — ONDANSETRON HCL 4 MG PO TABS: 4.0000 mg | ORAL_TABLET | Freq: Four times a day (QID) | ORAL | Status: DC | PRN

## 2017-11-01 MED ORDER — ONDANSETRON HCL 4 MG/2ML IJ SOLN: 4.0000 mg | Freq: Once | INTRAMUSCULAR | Status: DC | PRN

## 2017-11-01 MED ORDER — DOCUSATE SODIUM 100 MG PO CAPS
100.0000 mg | ORAL_CAPSULE | Freq: Two times a day (BID) | ORAL | Status: DC
  Administered 2017-11-01 – 2017-11-04 (×6): 100 mg via ORAL
  Filled 2017-11-01 (×6): qty 1

## 2017-11-01 SURGICAL SUPPLY — 35 items
BAG SPEC THK2 15X12 ZIP CLS (MISCELLANEOUS)
BAG ZIPLOCK 12X15 (MISCELLANEOUS) IMPLANT
BIT DRILL FLUTED FEMUR 4.2/3 (BIT) ×1 IMPLANT
BNDG GAUZE ELAST 4 BULKY (GAUZE/BANDAGES/DRESSINGS) ×1 IMPLANT
COVER PERINEAL POST (MISCELLANEOUS) ×2 IMPLANT
COVER SURGICAL LIGHT HANDLE (MISCELLANEOUS) ×2 IMPLANT
DRAPE C-ARM 42X120 X-RAY (DRAPES) ×1 IMPLANT
DRAPE INCISE IOBAN 66X45 STRL (DRAPES) ×2 IMPLANT
DRAPE ORTHO SPLIT 77X108 STRL (DRAPES) ×2
DRAPE STERI IOBAN 125X83 (DRAPES) ×2 IMPLANT
DRAPE SURG 17X11 SM STRL (DRAPES) IMPLANT
DRAPE SURG ORHT 6 SPLT 77X108 (DRAPES) IMPLANT
DURAPREP 26ML APPLICATOR (WOUND CARE) ×2 IMPLANT
ELECT REM PT RETURN 15FT ADLT (MISCELLANEOUS) ×2 IMPLANT
GAUZE SPONGE 4X4 12PLY STRL (GAUZE/BANDAGES/DRESSINGS) ×2 IMPLANT
GAUZE XEROFORM 1X8 LF (GAUZE/BANDAGES/DRESSINGS) ×2 IMPLANT
GLOVE BIO SURGEON STRL SZ7.5 (GLOVE) ×3 IMPLANT
GLOVE BIOGEL PI IND STRL 7.5 (GLOVE) IMPLANT
GLOVE BIOGEL PI INDICATOR 7.5 (GLOVE) ×1
GLOVE INDICATOR 8.0 STRL GRN (GLOVE) ×1 IMPLANT
GOWN STRL REUS W/TWL LRG LVL3 (GOWN DISPOSABLE) ×3 IMPLANT
GUIDEWIRE 3.2X400 (WIRE) ×1 IMPLANT
KIT BASIN OR (CUSTOM PROCEDURE TRAY) ×2 IMPLANT
MANIFOLD NEPTUNE II (INSTRUMENTS) ×2 IMPLANT
NAIL TROCH FIX 10X235 RT 130 (Nail) ×1 IMPLANT
PACK GENERAL/GYN (CUSTOM PROCEDURE TRAY) ×2 IMPLANT
POSITIONER SURGICAL ARM (MISCELLANEOUS) ×2 IMPLANT
SCREW LAG TFNA 90 HIP ×1 IMPLANT
SCREW LOCK STAR 5X34 (Screw) ×1 IMPLANT
SCREW LOCK TI 5.0X34 F/IM NAIL (Screw) ×1 IMPLANT
STAPLER VISISTAT 35W (STAPLE) ×2 IMPLANT
SUT VIC AB 2-0 CT1 27 (SUTURE) ×2
SUT VIC AB 2-0 CT1 TAPERPNT 27 (SUTURE) ×1 IMPLANT
TAPE CLOTH 4X10 WHT NS (GAUZE/BANDAGES/DRESSINGS) ×1 IMPLANT
TOWEL OR 17X26 10 PK STRL BLUE (TOWEL DISPOSABLE) ×2 IMPLANT

## 2017-11-01 NOTE — ED Provider Notes (Signed)
[facility]-EMERGENCY DEPT Provider Note   CSN: [id number] Arrival date & time: 11/01/17  [id number]     History   Chief Complaint Chief Complaint  Patient presents with  . Fall    HPI Kim Adams is a 76 y.o. female.  HPI 76 year old female who presents after fall this morning.  She is a resident at [facility].  She has a history of depression, hypertension and diabetes.  She is not anticoagulated.  States that she was doing her normal morning walk, when she tripped over her shoes and fell on her right leg.  States that she did not hit her head or have loss of consciousness.  States that due to right upper leg and buttock pain, that she could not get up or ambulate.  Denies headache, numbness or weakness, vomiting, recent illnesses, fever, chest pain, back pain, neck pain, abdominal pain or difficulty breathing.  Do not take any medications prior to arrival. Past Medical History:  Diagnosis Date  . Anxiety   . Depression   . Diabetes mellitus   . Hypertension     There are no active problems to display for this patient.   Past Surgical History:  Procedure Laterality Date  . TONSILLECTOMY      OB History    No data available       Home Medications    Prior to Admission medications   Medication Sig Start Date End Date Taking? Authorizing Provider  enalapril (VASOTEC) 5 MG tablet Take 5 mg by mouth daily.      [provider]  furosemide (LASIX) 20 MG tablet Take 20 mg by mouth daily.      [provider]  labetalol (NORMODYNE) 100 MG tablet Take 100 mg by mouth 2 (two) times daily.      [provider]  metFORMIN (GLUCOPHAGE) 850 MG tablet Take 850 mg by mouth 2 (two) times daily with a meal.      [provider]  simvastatin (ZOCOR) 20 MG tablet Take 20 mg by mouth 2 (two) times daily.      [provider]    Family History No family history on file.  Social History Social History   Tobacco Use    . Smoking status: Never Smoker  . Smokeless tobacco: Never Used  Substance Use Topics  . Alcohol use: No  . Drug use: No     Allergies   Patient has no known allergies.   Review of Systems Review of Systems  Constitutional: Negative for fever.  Respiratory: Negative for shortness of breath.   Cardiovascular: Negative for chest pain.  Musculoskeletal: Negative for back pain and neck pain.  Neurological: Negative for headaches.  Hematological: Does not bruise/bleed easily.  Psychiatric/Behavioral: Negative for confusion.  All other systems reviewed and are negative.    Physical Exam Updated Vital Signs BP 125/78   Pulse 87   Temp 97.9 F (36.6 C) (Oral)   Resp 20   SpO2 96%   Physical Exam Physical Exam  Nursing note and vitals reviewed. Constitutional: Well developed, well nourished, non-toxic, and in no acute distress Head: Normocephalic and atraumatic.  Mouth/Throat: Oropharynx is clear and moist.  Neck: Normal range of motion. Neck supple. No cervical spine tenderness Cardiovascular: Normal rate and regular rhythm.  +2 DP pulses bilaterally Pulmonary/Chest: Effort normal and breath sounds normal.  Abdominal: Soft. There is no tenderness. There is no rebound and no guarding.  Musculoskeletal: Right leg does appear to be slightly shortened  and internally rotated.  There is tenderness in the right hip to palpation.  No obvious deformities or bruising. Neurological: Alert, no facial droop, fluent speech, full strength bilateral ankle dorsi and plantarflexion, sensation intact throughout to light touch, equal handgrips bilaterally, pupils equal reactive to light Skin: Skin is warm and dry.  Psychiatric: Cooperative   ED Treatments / Results  Labs (all labs ordered are listed, but only abnormal results are displayed) Labs Reviewed  CBC WITH DIFFERENTIAL/PLATELET  BASIC METABOLIC PANEL    EKG  EKG Interpretation  Date/Time:  Tuesday November 01 2017 07:49:15  EST Ventricular Rate:  84 PR Interval:    QRS Duration: 93 QT Interval:  369 QTC Calculation: 437 R Axis:   42 Text Interpretation:  Sinus rhythm Multiple ventricular premature complexes Probable left atrial enlargement Low voltage, precordial leads no prior EKG  Confirmed by Crista CurbLiu, Tarl Cephas [phone]([id number]) on 11/01/2017 7:55:33 AM       Radiology Dg Chest 1 View  Result Date: 11/01/2017 CLINICAL DATA:  Fall, right hip fracture. EXAM: CHEST 1 VIEW COMPARISON:  None. FINDINGS: Heart is borderline in size. Mild hyperinflation of the lungs. No confluent opacities or effusions. No acute bony abnormality. IMPRESSION: Hyperinflation.  No active disease. Electronically Signed   By: Charlett NoseKevin  Dover M.D.   On: 11/01/2017 08:32   Dg Hip Unilat W Or Wo Pelvis 2-3 Views Right  Result Date: 11/01/2017 CLINICAL DATA:  Fall, right hip EXAM: DG HIP (WITH OR WITHOUT PELVIS) 2-3V RIGHT COMPARISON:  None. FINDINGS: There is a right femoral intertrochanteric fracture with displacement of the lesser trochanter and varus angulation. No subluxation or dislocation. IMPRESSION: Displaced right femoral intertrochanteric fracture with varus angulation. Electronically Signed   By: Charlett NoseKevin  Dover M.D.   On: 11/01/2017 08:31    Procedures Procedures (including critical care time)  Medications Ordered in ED Medications  fentaNYL (SUBLIMAZE) injection 50 mcg (50 mcg Intravenous Refused 11/01/17 0803)  0.9 %  sodium chloride infusion (not administered)     Initial Impression / Assessment and Plan / ED Course  I have reviewed the triage vital signs and the nursing notes.  Pertinent labs & imaging results that were available during my care of the patient were reviewed by me and considered in my medical decision making (see chart for details).     76 year old female who presents after mechanical fall with right hip pain.  Her mental status is normal.  No focal neurological deficits.  No evidence of head trauma.  Right lower  extremity is internally rotated and shortened.  X-rays visualized and she does have a right intertrochanteric femur fracture.  I spoke with Dr. Aundria Rudogers who will plan on potential operative repair later on today.  At this time is pending blood work.  I did speak to Dr. Onalee Huaavid who will ultimately admit patient after blood work returns.  Kim Adams is DPOA per patient. Requests to be notified regarding surgery details. [phone]  Final Clinical Impressions(s) / ED Diagnoses   Final diagnoses:  Closed fracture of right hip, initial encounter [facility](HCC)    ED Discharge Orders    None       Lavera GuiseLiu, Ismahan Lippman Duo, MD 11/01/17 [phone]

## 2017-11-01 NOTE — Transfer of Care (Signed)
Immediate Anesthesia Transfer of Care Note  Patient: Kim Adams  Procedure(s) Performed: Procedure(s): INTRAMEDULLARY (IM) NAIL INTERTROCHANTRIC (Right)  Patient Location: PACU  Anesthesia Type:General  Level of Consciousness:  sedated, patient cooperative and responds to stimulation  Airway & Oxygen Therapy:Patient Spontanous Breathing and Patient connected to face mask oxgen  Post-op Assessment:  Report given to PACU RN and Post -op Vital signs reviewed and stable  Post vital signs:  Reviewed and stable  Last Vitals:  Vitals:   11/01/17 0730 11/01/17 0949  BP: 125/78 (!) 159/90  Pulse: 87 96  Resp:  (!) 23  Temp:    SpO2: 96% 98%    Complications: No apparent anesthesia complications

## 2017-11-01 NOTE — Anesthesia Preprocedure Evaluation (Signed)
Anesthesia Evaluation  Patient identified by MRN, date of birth, ID band Patient awake    Reviewed: Allergy & Precautions, NPO status , Patient's Chart, lab work & pertinent test results  Airway Mallampati: I  TM Distance: >3 FB Neck ROM: Full    Dental   Pulmonary    Pulmonary exam normal        Cardiovascular hypertension, Pt. on medications Normal cardiovascular exam     Neuro/Psych Anxiety Depression    GI/Hepatic   Endo/Other  diabetes, Type 2  Renal/GU      Musculoskeletal   Abdominal   Peds  Hematology   Anesthesia Other Findings   Reproductive/Obstetrics                             Anesthesia Physical Anesthesia Plan  ASA: III  Anesthesia Plan: General   Post-op Pain Management:    Induction:   PONV Risk Score and Plan: 3 and Ondansetron, Midazolam, Dexamethasone and Treatment may vary due to age or medical condition  Airway Management Planned: Oral ETT  Additional Equipment:   Intra-op Plan:   Post-operative Plan: Extubation in OR  Informed Consent: I have reviewed the patients History and Physical, chart, labs and discussed the procedure including the risks, benefits and alternatives for the proposed anesthesia with the patient or authorized representative who has indicated his/her understanding and acceptance.     Plan Discussed with: CRNA and Surgeon  Anesthesia Plan Comments:         Anesthesia Quick Evaluation

## 2017-11-01 NOTE — ED Notes (Signed)
Call POA sister Conchita Parisancy Fox with updates [phone]

## 2017-11-01 NOTE — Anesthesia Postprocedure Evaluation (Signed)
Anesthesia Post Note  Patient: Kim Adams  Procedure(s) Performed: INTRAMEDULLARY (IM) NAIL INTERTROCHANTRIC (Right Hip)     Patient location during evaluation: PACU Anesthesia Type: General Level of consciousness: awake and alert Pain management: pain level controlled Vital Signs Assessment: post-procedure vital signs reviewed and stable Respiratory status: spontaneous breathing, nonlabored ventilation, respiratory function stable and patient connected to nasal cannula oxygen Cardiovascular status: blood pressure returned to baseline and stable Postop Assessment: no apparent nausea or vomiting Anesthetic complications: no    Last Vitals:  Vitals:   11/01/17 1430 11/01/17 1529  BP: 122/62 118/68  Pulse: 88 90  Resp: 18 18  Temp: 37.2 C 37 C  SpO2: 100% 100%    Last Pain:  Vitals:   11/01/17 1529  TempSrc: Oral  PainSc:                  Chiquita Heckert DAVID

## 2017-11-01 NOTE — Brief Op Note (Signed)
11/01/2017  11:47 AM  PATIENT:  Kim Adams  76 y.o. female  PRE-OPERATIVE DIAGNOSIS:  FRACTURED RIGHT HIP  POST-OPERATIVE DIAGNOSIS:  fractured right hip  PROCEDURE:  Procedure(s): INTRAMEDULLARY (IM) NAIL INTERTROCHANTRIC (Right)  SURGEON:  Surgeon(s) and Role:    * Yolonda Kidaogers, Jason Patrick, MD - Primary  PHYSICIAN ASSISTANT:   ASSISTANTS: none   ANESTHESIA:   general  EBL:  50 mL   BLOOD ADMINISTERED:none  DRAINS: none   LOCAL MEDICATIONS USED:  NONE  SPECIMEN:  No Specimen  DISPOSITION OF SPECIMEN:  N/A  COUNTS:  YES  TOURNIQUET:  * No tourniquets in log *  DICTATION: .Note written in EPIC  PLAN OF CARE: Admit to inpatient   PATIENT DISPOSITION:  PACU - hemodynamically stable.   Delay start of Pharmacological VTE agent (>24hrs) due to surgical blood loss or risk of bleeding: not applicable

## 2017-11-01 NOTE — ED Notes (Signed)
[facility] Expected date:  Expected time:  Means of arrival:  Comments: 76 y/o fall go to 6824

## 2017-11-01 NOTE — ED Triage Notes (Signed)
Pt coming from [facility].  Pt was doing her normal 4am walking routine.  Pt walks every morning from her independent room to the skilled nursing area to get a cup of water.  Pt tripped on the curb walking.  Pt stated to EMS that she twisted her leg and landed on the ground.  Pt denies LOC.  Pt not on any blood thinners.  Pt a/o x 4 and ambulatory prior to falling.  EMS did note any obvious deformities, crepitus, or neuro deficits. Pt states her legs hurts her to stand.  Right leg is painful to palpation.

## 2017-11-01 NOTE — ED Notes (Signed)
Bed: [facility]24 Expected date:  Expected time:  Means of arrival:  Comments: Pt still in room 

## 2017-11-01 NOTE — Consult Note (Signed)
ORTHOPAEDIC CONSULTATION  REQUESTING PHYSICIAN: Haydee Monicaavid, Rachal A, MD  PCP:  Merlene LaughterStoneking, Hal, MD  Chief Complaint: Right hip fracture  HPI: Kim Adams is a 76 y.o. female who complains of right hip pain following a fall earlier this morning.  She was in her normal state of health at her skilled nursing facility taking her daily for a.m. walk.  She fell onto the curb on her right side.  She presented to the emergency department where she was found to have a right intertrochanteric hip fracture.  I was consulted for management.  She states that she tripped over her shoes and fell onto the right leg.  Currently she denies any chest or head pain or pain in any other aspects of her body.  She denies numbness or tingling at this time.  Past Medical History:  Diagnosis Date  . Anxiety   . Depression   . Diabetes mellitus   . Hypertension    Past Surgical History:  Procedure Laterality Date  . TONSILLECTOMY     Social History   Socioeconomic History  . Marital status: Single    Spouse name: None  . Number of children: None  . Years of education: None  . Highest education level: None  Social Needs  . Financial resource strain: None  . Food insecurity - worry: None  . Food insecurity - inability: None  . Transportation needs - medical: None  . Transportation needs - non-medical: None  Occupational History  . None  Tobacco Use  . Smoking status: Never Smoker  . Smokeless tobacco: Never Used  Substance and Sexual Activity  . Alcohol use: No  . Drug use: No  . Sexual activity: No  Other Topics Concern  . None  Social History Narrative  . None   No family history on file. No Known Allergies Prior to Admission medications   Medication Sig Start Date End Date Taking? Authorizing Provider  enalapril (VASOTEC) 5 MG tablet Take 5 mg by mouth daily.      [provider]  furosemide (LASIX) 20 MG tablet Take 20 mg by mouth daily.      [provider]  labetalol  (NORMODYNE) 100 MG tablet Take 100 mg by mouth 2 (two) times daily.      [provider]  metFORMIN (GLUCOPHAGE) 850 MG tablet Take 850 mg by mouth 2 (two) times daily with a meal.      [provider]  simvastatin (ZOCOR) 20 MG tablet Take 20 mg by mouth 2 (two) times daily.      [provider]   Dg Chest 1 View  Result Date: 11/01/2017 CLINICAL DATA:  Fall, right hip fracture. EXAM: CHEST 1 VIEW COMPARISON:  None. FINDINGS: Heart is borderline in size. Mild hyperinflation of the lungs. No confluent opacities or effusions. No acute bony abnormality. IMPRESSION: Hyperinflation.  No active disease. Electronically Signed   By: Charlett NoseKevin  Dover M.D.   On: 11/01/2017 08:32   Dg Hip Unilat W Or Wo Pelvis 2-3 Views Right  Result Date: 11/01/2017 CLINICAL DATA:  Fall, right hip EXAM: DG HIP (WITH OR WITHOUT PELVIS) 2-3V RIGHT COMPARISON:  None. FINDINGS: There is a right femoral intertrochanteric fracture with displacement of the lesser trochanter and varus angulation. No subluxation or dislocation. IMPRESSION: Displaced right femoral intertrochanteric fracture with varus angulation. Electronically Signed   By: Charlett NoseKevin  Dover M.D.   On: 11/01/2017 08:31    Positive ROS: All other systems have been reviewed and were otherwise  negative with the exception of those mentioned in the HPI and as above.  Physical Exam: General: Alert, no acute distress Cardiovascular: No pedal edema Respiratory: No cyanosis, no use of accessory musculature GI: No organomegaly, abdomen is soft and non-tender Skin: No lesions in the area of chief complaint Neurologic: Sensation intact distally Psychiatric: Patient is competent for consent with normal mood and affect Lymphatic: No axillary or cervical lymphadenopathy  MUSCULOSKELETAL:  RLE:   Shortened, neutral alignment, calf soft and nontender.  +NVI distally  Assessment: Right hip intertrochanteric fracture, closed.  Plan: - plan for OR  today for fixation of right closed hip fracture - I have discussed this treatment plan with the patient, and her HCPOA her sister Conchita Parisancy Fox, who provided informed consent to proceed with surgical fixation. - will admit to medicine service for perioperative care, we appreciate their care     Yolonda KidaJason Patrick Donald Jacque, MD Cell [phone]    11/01/2017 9:19 AM

## 2017-11-01 NOTE — Discharge Instructions (Signed)
Orthopedic surgery discharge instructions: -Okay for full weightbearing as tolerated to the right lower extremity. -Maintain postoperative bandage until your follow-up appointment with Dr. Aundria Rudogers in 2-3 weeks.  You may shower with this bandage in place but do not submerge underwater. -For the prevention of blood clots take 1 baby aspirin twice daily for 6 weeks. -Return to see Dr. Aundria Rudogers in 2-3 weeks for postoperative wound check.

## 2017-11-01 NOTE — Op Note (Signed)
Date of Surgery: 11/01/2017  INDICATIONS: Ms. Kim Adams is a 76 y.o.-year-old female who sustained a right hip fracture.  She was in her normal state of health, ambulating without any assistive devices, when she fell onto the curb at her skilled nursing facility.  [facility] and found to have a right intertrochanteric hip fracture.  She was indicated for operative fixation in an urgent fashion.  The risks and benefits of the procedure discussed with the patient and And her Sister Conchita Parisancy Fox, healthcare power of attorney prior to the procedure and all questions were answered; consent was obtained.  PREOPERATIVE DIAGNOSIS: right hip fracture   POSTOPERATIVE DIAGNOSIS: Same   PROCEDURE: Treatment of intertrochanteric, pertrochanteric, subtrochanteric fracture with intramedullary implant. CPT [phone]   SURGEON: Kathi DerJason P. Aundria Rudogers, M.D.   ANESTHESIA: general   IV FLUIDS AND URINE: See anesthesia record   ESTIMATED BLOOD LOSS: 50 cc  IMPLANTS:   Synthes 10 mm x 235 mm TFN A 95 mm proximal compression screw 34 mm x 5.0 mm interlock screw  DRAINS: None.   COMPLICATIONS: None.   DESCRIPTION OF PROCEDURE: The patient was brought to the operating room and placed supine on the operating table. The patient's leg had been signed prior to the procedure. The patient had the anesthesia placed by the anesthesiologist. The prep verification and incision time-outs were performed to confirm that this was the correct patient, site, side and location. The patient had an SCD on the opposite lower extremity. The patient did receive antibiotics prior to the incision and was re-dosed during the procedure as needed at indicated intervals. The patient was positioned on the fracture table with the table in traction and internal rotation to reduce the hip. The well leg was placed in a scissor position and all bony prominences were well-padded. The patient had the lower  extremity prepped and draped in the standard surgical fashion. The incision was made 4 finger breadths superior to the greater trochanter. A guide pin was inserted into the tip of the greater trochanter under fluoroscopic guidance. An opening reamer was used to gain access to the femoral canal. The nail length was measured and inserted down the femoral canal to its proper depth. The appropriate version of insertion for the lag screw was found under fluoroscopy. A pin was inserted up the femoral neck through the jig. The length of the lag screw was then measured. The lag screw was inserted as near to center-center in the head as possible. The leg was taken out of traction, then the compression screw was used to compress across the fracture. Compression was visualized on serial xrays.   We next turned our attention to the distal interlocking screw.  This was placed through the drill guide of the nail inserter.  A small incision was made overlying the lateral thigh at the screw site, and a tonsil was used to disect down to bone.  A drill pass was made through the jig and across the nail through both cortices.  This was measured, and the appropriate screw was placed under hand power and found to have good bit.    The wound was copiously irrigated with saline and the subcutaneous layer closed with 2.0 vicryl and the skin was reapproximated with staples. The wounds were cleaned and dried a final time and a sterile water occlusive dressing was placed. The hip was taken through a range of motion at the end of the case under fluoroscopic imaging  to visualize the approach-withdraw phenomenon and confirm implant length in the head. The patient was then awakened from anesthesia and taken to the recovery room in stable condition. All counts were correct at the end of the case.   POSTOPERATIVE PLAN: The patient will be weight bearing as tolerated and will return in 2 weeks for staple removal and the patient will receive DVT  prophylaxis based on other medications, activity level, and risk ratio of bleeding to thrombosis.  Our recommendation will be for twice daily baby aspirin for 6 weeks for DVT prophylaxis.   Maryan RuedJason P Rogers, MD [facility] [phone] 11:47 AM

## 2017-11-01 NOTE — H&P (Signed)
History and Physical    Kim HoppingJoann Adams NWG:[id number] DOB: [birth year] DOA: 11/01/2017  PCP: Merlene LaughterStoneking, Hal, MD  Patient coming from:  Assisted living  Chief Complaint:  Larey SeatFell, hurt right hip  HPI: Kim Adams is a 76 y.o. female with medical history significant of htn, dm comes in after falling on some rocks while taking her morning walk at her assisted living.  She could not get up due to right leg pain.  She seems to have some mild dementia, but not listed on her records with hardly no records at all from assisted living.  She says she has diet controlled dm and htn.  Her pain is well controlled.  Found to have hip fracture and referred for admission for surgical repair.  Review of Systems: As per HPI otherwise 10 point review of systems negative but possibly unreliable  Past Medical History:  Diagnosis Date  . Anxiety   . Depression   . Diabetes mellitus   . Hypertension     Past Surgical History:  Procedure Laterality Date  . TONSILLECTOMY       reports that  has never smoked. she has never used smokeless tobacco. She reports that she does not drink alcohol or use drugs.  Reports never married, no children has one sister lives in [place] does not know her number and not in her facesheet from assisted living  No Known Allergies  History reviewed. No pertinent family history. no premature CAD  Prior to Admission medications   Medication Sig Start Date End Date Taking? Authorizing Provider  enalapril (VASOTEC) 5 MG tablet Take 5 mg by mouth daily.      [provider]  furosemide (LASIX) 20 MG tablet Take 20 mg by mouth daily.      [provider]  labetalol (NORMODYNE) 100 MG tablet Take 100 mg by mouth 2 (two) times daily.      [provider]  metFORMIN (GLUCOPHAGE) 850 MG tablet Take 850 mg by mouth 2 (two) times daily with a meal.      [provider]  simvastatin (ZOCOR) 20 MG tablet Take 20 mg by mouth 2 (two) times daily.       [provider]    Physical Exam: Vitals:   11/01/17 0536 11/01/17 0730  BP: (!) 152/67 125/78  Pulse: 79 87  Resp: 20   Temp: 97.9 F (36.6 C)   TempSrc: Oral   SpO2: 98% 96%      Constitutional: NAD, calm, comfortable Vitals:   11/01/17 0536 11/01/17 0730  BP: (!) 152/67 125/78  Pulse: 79 87  Resp: 20   Temp: 97.9 F (36.6 C)   TempSrc: Oral   SpO2: 98% 96%   Eyes: PERRL, lids and conjunctivae normal ENMT: Mucous membranes are moist. Posterior pharynx clear of any exudate or lesions.Normal dentition.  Neck: normal, supple, no masses, no thyromegaly Respiratory: clear to auscultation bilaterally, no wheezing, no crackles. Normal respiratory effort. No accessory muscle use.  Cardiovascular: Regular rate and rhythm, no murmurs / rubs / gallops. No extremity edema. 2+ pedal pulses. No carotid bruits.  Abdomen: no tenderness, no masses palpated. No hepatosplenomegaly. Bowel sounds positive.  Musculoskeletal: no clubbing / cyanosis. No joint deformity upper and lower extremities. Good ROM, no contractures. Normal muscle tone.  Skin: no rashes, lesions, ulcers. No induration Neurologic: CN 2-12 grossly intact. Sensation intact, DTR normal. Strength 5/5 in all 4.  Psychiatric: Normal judgment and insight. Alert and oriented x 2. Normal mood.  Labs on Admission: I have personally reviewed following labs and imaging studies  CBC: No results for input(s): WBC, NEUTROABS, HGB, HCT, MCV, PLT in the last 168 hours. Basic Metabolic Panel: No results for input(s): NA, K, CL, CO2, GLUCOSE, BUN, CREATININE, CALCIUM, MG, PHOS in the last 168 hours. GFR: CrCl cannot be calculated (No order found.). Liver Function Tests: No results for input(s): AST, ALT, ALKPHOS, BILITOT, PROT, ALBUMIN in the last 168 hours. No results for input(s): LIPASE, AMYLASE in the last 168 hours. No results for input(s): AMMONIA in the last 168 hours. Coagulation Profile: No results for input(s):  INR, PROTIME in the last 168 hours. Cardiac Enzymes: No results for input(s): CKTOTAL, CKMB, CKMBINDEX, TROPONINI in the last 168 hours. BNP (last 3 results) No results for input(s): PROBNP in the last 8760 hours. HbA1C: No results for input(s): HGBA1C in the last 72 hours. CBG: No results for input(s): GLUCAP in the last 168 hours. Lipid Profile: No results for input(s): CHOL, HDL, LDLCALC, TRIG, CHOLHDL, LDLDIRECT in the last 72 hours. Thyroid Function Tests: No results for input(s): TSH, T4TOTAL, FREET4, T3FREE, THYROIDAB in the last 72 hours. Anemia Panel: No results for input(s): VITAMINB12, FOLATE, FERRITIN, TIBC, IRON, RETICCTPCT in the last 72 hours. Urine analysis: No results found for: COLORURINE, APPEARANCEUR, LABSPEC, PHURINE, GLUCOSEU, HGBUR, BILIRUBINUR, KETONESUR, PROTEINUR, UROBILINOGEN, NITRITE, LEUKOCYTESUR Sepsis Labs: !!!!!!!!!!!!!!!!!!!!!!!!!!!!!!!!!!!!!!!!!!!! @LABRCNTIP (procalcitonin:4,lacticidven:4) )No results found for this or any previous visit (from the past 240 hour(s)).   Radiological Exams on Admission: Dg Chest 1 View  Result Date: 11/01/2017 CLINICAL DATA:  Fall, right hip fracture. EXAM: CHEST 1 VIEW COMPARISON:  None. FINDINGS: Heart is borderline in size. Mild hyperinflation of the lungs. No confluent opacities or effusions. No acute bony abnormality. IMPRESSION: Hyperinflation.  No active disease. Electronically Signed   By: Charlett NoseKevin  Dover M.D.   On: 11/01/2017 08:32   Dg Hip Unilat W Or Wo Pelvis 2-3 Views Right  Result Date: 11/01/2017 CLINICAL DATA:  Fall, right hip EXAM: DG HIP (WITH OR WITHOUT PELVIS) 2-3V RIGHT COMPARISON:  None. FINDINGS: There is a right femoral intertrochanteric fracture with displacement of the lesser trochanter and varus angulation. No subluxation or dislocation. IMPRESSION: Displaced right femoral intertrochanteric fracture with varus angulation. Electronically Signed   By: Charlett NoseKevin  Dover M.D.   On: 11/01/2017 08:31     EKG: Independently reviewed. nsr pvcs no old to c/w cxr reviewed no edema or infiltrate Records from assisted living reviewed, minimal  Case discussed with edp  Assessment/Plan 76 yo female with mechanical fall with right femoral fx displaced   Principal Problem:   Hip fracture (HCC)- right - to OR likely in next couple of hours.  Labs pending.  ekg bigiminy.  Ck mag level along with other electrolytes.  Ns ivf.  Npo.  Hold anticoagulants til post op further recs per ortho team.   Active Problems:   Prediabetes- ck glucose.  Ck hga1c.  Hold metformin.  ssi.   Fall at nursing home- noted   HTN - cont home meds post op   Obtain records from assisted living and pcp Presumptive full code, suspect pt has dementia but this is not in her chart, not oriented to time but knows today is christmas day   DVT prophylaxis:  scds Code Status:  full Family Communication:  none Disposition Plan:  Per day team Consults called:  Ortho  Admission status:  admit   Kim Adams A MD [facility]  If 7PM-7AM, please contact night-coverage [email or web address] Password [email or web address]  11/01/2017, 9:34 AM

## 2017-11-01 NOTE — Anesthesia Procedure Notes (Signed)
Procedure Name: Intubation Performed by: Anne Fu, CRNA Pre-anesthesia Checklist: Patient identified, Emergency Drugs available, Suction available, Patient being monitored and Timeout performed Patient Re-evaluated:Patient Re-evaluated prior to induction Oxygen Delivery Method: Circle system utilized Preoxygenation: Pre-oxygenation with 100% oxygen Induction Type: IV induction Ventilation: Mask ventilation without difficulty Laryngoscope Size: Mac and 4 Grade View: Grade I Tube type: Oral Tube size: 7.0 mm Number of attempts: 1 Airway Equipment and Method: Stylet Placement Confirmation: ETT inserted through vocal cords under direct vision,  positive ETCO2 and breath sounds checked- equal and bilateral Secured at: 20 cm Tube secured with: Tape Dental Injury: Teeth and Oropharynx as per pre-operative assessment

## 2017-11-02 ENCOUNTER — Encounter (HOSPITAL_COMMUNITY): Payer: Self-pay | Admitting: Orthopedic Surgery

## 2017-11-02 DIAGNOSIS — S72001A Fracture of unspecified part of neck of right femur, initial encounter for closed fracture: Secondary | ICD-10-CM

## 2017-11-02 DIAGNOSIS — I10 Essential (primary) hypertension: Secondary | ICD-10-CM

## 2017-11-02 DIAGNOSIS — E119 Type 2 diabetes mellitus without complications: Secondary | ICD-10-CM

## 2017-11-02 DIAGNOSIS — R262 Difficulty in walking, not elsewhere classified: Secondary | ICD-10-CM

## 2017-11-02 LAB — GLUCOSE, CAPILLARY
GLUCOSE-CAPILLARY: 186 mg/dL — AB (ref 65–99)
GLUCOSE-CAPILLARY: 141 mg/dL — AB (ref 65–99)
GLUCOSE-CAPILLARY: 137 mg/dL — AB (ref 65–99)
GLUCOSE-CAPILLARY: 198 mg/dL — AB (ref 65–99)
Glucose-Capillary: 119 mg/dL — ABNORMAL HIGH (ref 65–99)
Glucose-Capillary: 181 mg/dL — ABNORMAL HIGH (ref 65–99)

## 2017-11-02 LAB — CBC
HCT: 22.6 % — ABNORMAL LOW (ref 36.0–46.0)
HEMOGLOBIN: 7.4 g/dL — AB (ref 12.0–15.0)
MCH: 30.7 pg (ref 26.0–34.0)
MCHC: 32.7 g/dL (ref 30.0–36.0)
MCV: 93.8 fL (ref 78.0–100.0)
Platelets: 188 10*3/uL (ref 150–400)
RBC: 2.41 MIL/uL — AB (ref 3.87–5.11)
RDW: 13.6 % (ref 11.5–15.5)
WBC: 6.6 10*3/uL (ref 4.0–10.5)

## 2017-11-02 LAB — BASIC METABOLIC PANEL
Anion gap: 5 (ref 5–15)
BUN: 23 mg/dL — AB (ref 6–20)
CHLORIDE: 110 mmol/L (ref 101–111)
CO2: 24 mmol/L (ref 22–32)
Calcium: 8.2 mg/dL — ABNORMAL LOW (ref 8.9–10.3)
Creatinine, Ser: 0.87 mg/dL (ref 0.44–1.00)
GFR calc non Af Amer: >60 mL/min (ref >60)
Glucose, Bld: 217 mg/dL — ABNORMAL HIGH (ref 65–99)
POTASSIUM: 4.4 mmol/L (ref 3.5–5.1)
SODIUM: 139 mmol/L (ref 135–145)

## 2017-11-02 NOTE — Evaluation (Signed)
Physical Therapy Evaluation Patient Details Name: Kim Adams MRN: [id number] DOB: Mar 18, 1941 Today's Date: 11/02/2017   History of Present Illness  s/p R IM nail  PMH:  HTN, DM, anxiety  Clinical Impression  Pt admitted as above and presenting with functional mobility limitations 2* decreased strength/ROM R LE, post op pain, limited endurance and elevated anxiety level.  Pt would benefit from follow up rehab at SNF level to maximize IND and safety.    Follow Up Recommendations SNF    Equipment Recommendations       Recommendations for Other Services       Precautions / Restrictions Precautions Precautions: Fall Restrictions Weight Bearing Restrictions: No      Mobility  Bed Mobility Overal bed mobility: Needs Assistance Bed Mobility: Supine to Sit;Sit to Supine     Supine to sit: Max assist;+2 for physical assistance Sit to supine: Max assist;+2 for physical assistance   General bed mobility comments: assist for trunk and legs  Transfers Overall transfer level: Needs assistance Equipment used: Rolling walker (2 wheeled) Transfers: Sit to/from Stand Sit to Stand: Min assist;+2 physical assistance;From elevated surface         General transfer comment: assist to rise and steady.  Pt unable to take steps  Ambulation/Gait Ambulation/Gait assistance: +2 physical assistance;+2 safety/equipment;Mod assist;Max assist           General Gait Details: Pt stood with RW only but unable to initiate step with either LE and returned to bed with c/o dizziness  Stairs            Wheelchair Mobility    Modified Rankin (Stroke Patients Only)       Balance Overall balance assessment: Needs assistance Sitting-balance support: Feet supported Sitting balance-Leahy Scale: Fair     Standing balance support: Bilateral upper extremity supported Standing balance-Leahy Scale: Poor                               Pertinent Vitals/Pain Pain Assessment:  Faces Faces Pain Scale: Hurts even more Pain Location: R hip Pain Descriptors / Indicators: Sore Pain Intervention(s): Limited activity within patient's tolerance;Monitored during session;Premedicated before session;Ice applied    Home Living Family/patient expects to be discharged to:: Skilled nursing facility                 Additional Comments: lives in [place]; independent living.  walks 2 miles a day.      Prior Function Level of Independence: Independent               Hand Dominance        Extremity/Trunk Assessment   Upper Extremity Assessment Upper Extremity Assessment: Overall WFL for tasks assessed    Lower Extremity Assessment Lower Extremity Assessment: RLE deficits/detail    Cervical / Trunk Assessment Cervical / Trunk Assessment: Kyphotic  Communication   Communication: HOH  Cognition Arousal/Alertness: Awake/alert Behavior During Therapy: WFL for tasks assessed/performed                                   General Comments: slow processing; multimodal cues. Pt is HOH      General Comments General comments (skin integrity, edema, etc.): Bp after sitting 96/53    Exercises     Assessment/Plan    PT Assessment    PT Problem List         PT  Treatment Interventions      PT Goals (Current goals can be found in the Care Plan section)  Acute Rehab PT Goals Patient Stated Goal: [facility] prior to returning to independent living PT Goal Formulation: With patient Time For Goal Achievement: 11/16/18 Potential to Achieve Goals: Fair    Frequency     Barriers to discharge        Co-evaluation               AM-PAC PT "6 Clicks" Daily Activity  Outcome Measure Difficulty turning over in bed (including adjusting bedclothes, sheets and blankets)?: Unable Difficulty moving from lying on back to sitting on the side of the bed? : Unable Difficulty sitting down on and standing up from a chair  with arms (e.g., wheelchair, bedside commode, etc,.)?: Unable Help needed moving to and from a bed to chair (including a wheelchair)?: A Lot Help needed walking in hospital room?: Total Help needed climbing 3-5 steps with a railing? : Total 6 Click Score: 7    End of Session Equipment Utilized During Treatment: Gait belt Activity Tolerance: Patient limited by fatigue Patient left: in bed;with call bell/phone within reach;with family/visitor present Nurse Communication: Mobility status PT Visit Diagnosis: Unsteadiness on feet (R26.81)    Time: [id number] PT Time Calculation (min) (ACUTE ONLY): 25 min   Charges:   PT Evaluation $PT Eval Low Complexity: 1 Low     PT G Codes:        Pg [phone]   Serenna Deroy 11/02/2017, 12:59 PM

## 2017-11-02 NOTE — Evaluation (Signed)
Occupational Therapy Evaluation Patient Details Name: Kim Adams MRN: [id number] DOB: 15-Sep-1941 Today's Date: 11/02/2017    History of Present Illness s/p R IM nail  PMH:  HTN, DM, anxiety   Clinical Impression   This 76 year old female was admitted for the above. She was independent and active prior to this fall.  Goals in acute are for min A overall.    Follow Up Recommendations  SNF    Equipment Recommendations  3 in 1 bedside commode    Recommendations for Other Services       Precautions / Restrictions Precautions Precautions: Fall Restrictions Weight Bearing Restrictions: No      Mobility Bed Mobility Overal bed mobility: Needs Assistance Bed Mobility: Supine to Sit;Sit to Supine     Supine to sit: Max assist;+2 for physical assistance Sit to supine: Max assist;+2 for physical assistance   General bed mobility comments: assist for trunk and legs  Transfers Overall transfer level: Needs assistance Equipment used: Rolling walker (2 wheeled) Transfers: Sit to/from Stand Sit to Stand: Min assist;+2 physical assistance;From elevated surface         General transfer comment: assist to rise and steady.  Pt unable to take steps    Balance Overall balance assessment: Needs assistance Sitting-balance support: Feet supported Sitting balance-Leahy Scale: Fair(initially poor with reliance on arms)       Standing balance-Leahy Scale: Poor                             ADL either performed or assessed with clinical judgement   ADL Overall ADL's : Needs assistance/impaired Eating/Feeding: Independent   Grooming: Set up;Sitting   Upper Body Bathing: Set up;Sitting   Lower Body Bathing: Maximal assistance;Sit to/from stand   Upper Body Dressing : Minimal assistance;Sitting   Lower Body Dressing: Total assistance;Sit to/from stand                 General ADL Comments: pt stood; unable to take steps.  She was not pushing down on walker  despite multimodal cues     Vision         Perception     Praxis      Pertinent Vitals/Pain Pain Assessment: Faces Faces Pain Scale: Hurts even more Pain Location: R hip Pain Descriptors / Indicators: Sore Pain Intervention(s): Limited activity within patient's tolerance;Monitored during session;Premedicated before session;Repositioned     Hand Dominance     Extremity/Trunk Assessment Upper Extremity Assessment Upper Extremity Assessment: Overall WFL for tasks assessed(grossly 4/5)           Communication Communication Communication: HOH   Cognition Arousal/Alertness: Awake/alert Behavior During Therapy: WFL for tasks assessed/performed                                   General Comments: slow processing; multimodal cues. Pt is HOH   General Comments  BP after sitting 96/53    Exercises     Shoulder Instructions      Home Living Family/patient expects to be discharged to:: Skilled nursing facility                                 Additional Comments: lives in [place]; independent living.  walks 2 miles a day.        Prior Functioning/Environment Level of  Independence: Independent                 OT Problem List: Decreased strength;Decreased activity tolerance;Decreased knowledge of use of DME or AE;Pain      OT Treatment/Interventions: Self-care/ADL training;DME and/or AE instruction;Patient/family education;Balance training    OT Goals(Current goals can be found in the care plan section) Acute Rehab OT Goals Patient Stated Goal: [facility] prior to returning to independent living OT Goal Formulation: With patient Time For Goal Achievement: 11/16/17 Potential to Achieve Goals: Good ADL Goals Pt Will Transfer to Toilet: with min assist;bedside commode;stand pivot transfer Pt Will Perform Toileting - Clothing Manipulation and hygiene: with min assist;sit to/from stand Additional ADL Goal #1: pt  will complete LB adls with min A and  AE sit to stand Additional ADL Goal #2: pt will perform bed mobility with min A in preparation for toilet transfers  OT Frequency: Min 2X/week   Barriers to D/C:            Co-evaluation              AM-PAC PT "6 Clicks" Daily Activity     Outcome Measure Help from another person eating meals?: None Help from another person taking care of personal grooming?: A Little Help from another person toileting, which includes using toliet, bedpan, or urinal?: A Lot Help from another person bathing (including washing, rinsing, drying)?: A Lot Help from another person to put on and taking off regular upper body clothing?: A Little Help from another person to put on and taking off regular lower body clothing?: A Lot 6 Click Score: 16   End of Session    Activity Tolerance: Patient limited by fatigue(BP) Patient left: in bed;with call bell/phone within reach;with bed alarm set;with family/visitor present  OT Visit Diagnosis: Pain Pain - Right/Left: Right Pain - part of body: Hip                Time: [id number] OT Time Calculation (min): 23 min Charges:  OT General Charges $OT Visit: 1 Visit OT Evaluation $OT Eval Low Complexity: 1 Low G-Codes:     Kim Adams, OTR/L [phone] 11/02/2017  [name] 11/02/2017, 12:31 PM

## 2017-11-02 NOTE — Progress Notes (Signed)
   Subjective:  Patient reports pain as mild.  Patient states she has had a great day.  She denies any pain in the right hip at this time.  She has been up with therapy to the edge of the bed.  She has no complaints of feeling lightheaded or dizzy.  She denies chest pain or shortness of breath.  She denies nausea or vomiting.  Objective:   VITALS:   Vitals:   11/02/17 0331 11/02/17 0552 11/02/17 0809 11/02/17 0956  BP: (!) 90/52 (!) 100/50 (!) 120/58 (!) 109/51  Pulse: 76 81 81 87  Resp: 16  16 16   Temp: 99.4 F (37.4 C)   99.2 F (37.3 C)  TempSrc: Oral   Oral  SpO2: 94%  99% 100%    Neurovascular intact Sensation intact distally Intact pulses distally Dorsiflexion/Plantar flexion intact Incision: dressing C/D/I   Lab Results  Component Value Date   WBC 6.6 11/02/2017   HGB 7.4 (L) 11/02/2017   HCT 22.6 (L) 11/02/2017   MCV 93.8 11/02/2017   PLT 188 11/02/2017   BMET    Component Value Date/Time   NA 139 11/02/2017 0413   K 4.4 11/02/2017 0413   CL 110 11/02/2017 0413   CO2 24 11/02/2017 0413   GLUCOSE 217 (H) 11/02/2017 0413   BUN 23 (H) 11/02/2017 0413   CREATININE 0.87 11/02/2017 0413   CALCIUM 8.2 (L) 11/02/2017 0413   GFRNONAA >60 11/02/2017 0413   GFRAA >60 11/02/2017 0413     Assessment/Plan: 1 Day Post-Op   Principal Problem:   Hip fracture (HCC) Active Problems:   Prediabetes   Fall at nursing home   Up with therapy Weightbearing as tolerated to right lower extremity. Maintain postoperative bandages until follow-up with Dr. Aundria Rudogers. SCDs and twice daily aspirin for DVT prophylaxis. Likely discharge to skilled nursing facility post admission. Anemia per primary team, transfuse as needed under their guidance.   Kim KidaJason Patrick Adams 11/02/2017, 5:41 PM   Kim RuedJason P Rogers, MD [phone]

## 2017-11-02 NOTE — Progress Notes (Addendum)
PROGRESS NOTE  Kim HoppingJoann Scully ZOX:[id number] DOB: 04/26/1941 DOA: 11/01/2017 PCP: Merlene LaughterStoneking, Hal, MD  HPI/Recap of past 24 hours: Kim Adams is a 76 y.o. female with medical history significant of htn, diet controlled type 2 dm from independent living comes in after falling on some rocks while taking her morning walk at her assisted living.  She could not get up due to right leg pain.  She seems to have some mild dementia, but not listed on her records with hardly no records at all from assisted living. Right hip fracture on xray. Surgery contacted POD#0 post fixation of right closed hip fracture 11/01/17    Assessment/Plan: Principal Problem:   Hip fracture (HCC) Active Problems:   Prediabetes   Fall at nursing home   POD#1 post fixation of right closed hip fracture, poa 2/2 mechanical fall  Ortho following Pain management in place Bowel regimen due to use of opiates PT per ortho IV fluid hydration CBC am  Non insulin dependent type 2 diabetes diet controlled A1C 7.5 ISS with hypoglycemia protocol Diabetic cardiac diet  HTN No acute issues labetalol po 100 mg BID  Chronic normocytic anemia Has never had a colonoscopy-states she has always refused it  Ambulatory dysfunction PT evaluate and treat  HLD simvastatin   DVT prophylaxis:  scds; per ortho Code Status:  full Family Communication:  with sister at bedside Consults called:  Ortho       Objective: Vitals:   11/02/17 0331 11/02/17 0552 11/02/17 0809 11/02/17 0956  BP: (!) 90/52 (!) 100/50 (!) 120/58 (!) 109/51  Pulse: 76 81 81 87  Resp: 16  16 16   Temp: 99.4 F (37.4 C)   99.2 F (37.3 C)  TempSrc: Oral   Oral  SpO2: 94%  99% 100%    Intake/Output Summary (Last 24 hours) at 11/02/2017 1445 Last data filed at 11/02/2017 1400 Gross per 24 hour  Intake 3012.5 ml  Output 600 ml  Net 2412.5 ml   There were no vitals filed for this visit.  Exam:  Eyes: PERRL, lids and conjunctivae  normal ENMT: Mucous membranes are moist. Posterior pharynx clear of any exudate or lesions.Normal dentition.  Neck: normal, supple, no masses, no thyromegaly Respiratory: clear to auscultation bilaterally, no wheezing, no crackles. Normal respiratory effort. No accessory muscle use.  Cardiovascular: Regular rate and rhythm, no murmurs / rubs / gallops. No extremity edema. 2+ pedal pulses. No carotid bruits.  Abdomen: no tenderness, no masses palpated. No hepatosplenomegaly. Bowel sounds positive.  Musculoskeletal: site of incision lateral right hip appears clean with no bleeding or drainage. Surgical draining in place. Skin: as stated above Neurologic: non focal  Psychiatric: Mood appropriate for condition and setting   Data Reviewed: CBC: Recent Labs  Lab 11/01/17 0736 11/02/17 0413  WBC 12.7* 6.6  NEUTROABS 10.4*  --   HGB 10.2* 7.4*  HCT 30.9* 22.6*  MCV 92.2 93.8  PLT 228 188   Basic Metabolic Panel: Recent Labs  Lab 11/01/17 0736 11/01/17 1317 11/02/17 0413  NA 141  --  139  K 4.7  --  4.4  CL 110  --  110  CO2 24  --  24  GLUCOSE 170*  --  217*  BUN 30*  --  23*  CREATININE 0.87  --  0.87  CALCIUM 9.1  --  8.2*  MG  --  1.6*  --    GFR: CrCl cannot be calculated (Unknown ideal weight.). Liver Function Tests: No results for input(s): AST,  ALT, ALKPHOS, BILITOT, PROT, ALBUMIN in the last 168 hours. No results for input(s): LIPASE, AMYLASE in the last 168 hours. No results for input(s): AMMONIA in the last 168 hours. Coagulation Profile: Recent Labs  Lab 11/01/17 1317  INR 1.10   Cardiac Enzymes: No results for input(s): CKTOTAL, CKMB, CKMBINDEX, TROPONINI in the last 168 hours. BNP (last 3 results) No results for input(s): PROBNP in the last 8760 hours. HbA1C: Recent Labs    11/01/17 1317  HGBA1C 7.5*   CBG: Recent Labs  Lab 11/01/17 2012 11/01/17 2329 11/02/17 0334 11/02/17 0719 11/02/17 1231  GLUCAP 184* 143* 186* 141* 137*   Lipid  Profile: No results for input(s): CHOL, HDL, LDLCALC, TRIG, CHOLHDL, LDLDIRECT in the last 72 hours. Thyroid Function Tests: No results for input(s): TSH, T4TOTAL, FREET4, T3FREE, THYROIDAB in the last 72 hours. Anemia Panel: No results for input(s): VITAMINB12, FOLATE, FERRITIN, TIBC, IRON, RETICCTPCT in the last 72 hours. Urine analysis: No results found for: COLORURINE, APPEARANCEUR, LABSPEC, PHURINE, GLUCOSEU, HGBUR, BILIRUBINUR, KETONESUR, PROTEINUR, UROBILINOGEN, NITRITE, LEUKOCYTESUR Sepsis Labs: @LABRCNTIP (procalcitonin:4,lacticidven:4)  )No results found for this or any previous visit (from the past 240 hour(s)).    Studies: No results found.  Scheduled Meds: . aspirin EC  81 mg Oral BID  . docusate sodium  100 mg Oral BID  . enalapril  5 mg Oral Daily  . furosemide  20 mg Oral Daily  . insulin aspart  0-9 Units Subcutaneous Q4H  . labetalol  100 mg Oral BID  . senna  1 tablet Oral BID  . simvastatin  10 mg Oral q1800    Continuous Infusions: . methocarbamol (ROBAXIN)  IV       LOS: 1 day     Darlin Droparole N Janesa Dockery, MD [facility] Pager [phone]  If 7PM-7AM, please contact night-coverage [email or web address] Password [email or web address] 11/02/2017, 2:45 PM

## 2017-11-02 NOTE — Progress Notes (Signed)
Plan for d/c to SNF, discharge planning per CSW. [phone] 

## 2017-11-03 LAB — URINALYSIS, ROUTINE W REFLEX MICROSCOPIC
Bilirubin Urine: NEGATIVE
Glucose, UA: 50 mg/dL — AB
Hgb urine dipstick: NEGATIVE
Ketones, ur: 5 mg/dL — AB
LEUKOCYTES UA: NEGATIVE
NITRITE: NEGATIVE
PH: 5 (ref 5.0–8.0)
Protein, ur: NEGATIVE mg/dL
SPECIFIC GRAVITY, URINE: 1.023 (ref 1.005–1.030)

## 2017-11-03 LAB — CBC
HCT: 19.3 % — ABNORMAL LOW (ref 36.0–46.0)
Hemoglobin: 6.3 g/dL — CL (ref 12.0–15.0)
MCH: 30.7 pg (ref 26.0–34.0)
MCHC: 32.6 g/dL (ref 30.0–36.0)
MCV: 94.1 fL (ref 78.0–100.0)
PLATELETS: 160 10*3/uL (ref 150–400)
RBC: 2.05 MIL/uL — ABNORMAL LOW (ref 3.87–5.11)
RDW: 13.5 % (ref 11.5–15.5)
WBC: 7.5 10*3/uL (ref 4.0–10.5)

## 2017-11-03 LAB — GLUCOSE, CAPILLARY
GLUCOSE-CAPILLARY: 171 mg/dL — AB (ref 65–99)
GLUCOSE-CAPILLARY: 157 mg/dL — AB (ref 65–99)
Glucose-Capillary: 150 mg/dL — ABNORMAL HIGH (ref 65–99)
Glucose-Capillary: 133 mg/dL — ABNORMAL HIGH (ref 65–99)
Glucose-Capillary: 235 mg/dL — ABNORMAL HIGH (ref 65–99)

## 2017-11-03 LAB — PREPARE RBC (CROSSMATCH)

## 2017-11-03 LAB — HEMOGLOBIN AND HEMATOCRIT, BLOOD
HEMATOCRIT: 24 % — AB (ref 36.0–46.0)
Hemoglobin: 8 g/dL — ABNORMAL LOW (ref 12.0–15.0)

## 2017-11-03 MED ORDER — SODIUM CHLORIDE 0.9 % IV SOLN
Freq: Once | INTRAVENOUS | Status: AC
  Administered 2017-11-03: 09:00:00 via INTRAVENOUS

## 2017-11-03 NOTE — Progress Notes (Signed)
CRITICAL VALUE ALERT  Critical Value: Hbg 6.3  Date & Time Notied:  11/03/2017 0525am  Provider Notified: K.Kirby via Amion  Orders Received/Actions taken: Given orders to transfuse 1 unit of PRBCS.   Patient is unable to sign consent. Will need to obtain consent from POA Kim ParisNancy Fox.

## 2017-11-03 NOTE — NC FL2 (Signed)
[place] MEDICAID FL2 LEVEL OF CARE SCREENING TOOL     IDENTIFICATION  Patient Name: Kim HoppingJoann Torain Birthdate: 1940/12/12 Sex: female Admission Date (Current Location): 11/01/2017  [facility] [place]   Facility and Address:  [facility],  [address], [place] [id number]      Provider Number: [id number]  Attending Physician Name and Address:  Darlin DropHall, Carole N, DO  Relative Name and Phone Number:       Current Level of Care: SNF Recommended Level of Care: Skilled Nursing Facility Prior Approval Number:    Date Approved/Denied:   PASRR Number: Pending [place] Fam[place] Review   Discharge Plan: SNF    Current Diagnoses: Patient Active Problem List   Diagnosis Date Noted  . Hip fracture (HCC) 11/01/2017  . Prediabetes 11/01/2017  . Fall at nursing home 11/01/2017    Orientation RESPIRATION BLADDER Height & Weight     Self, Time, Situation, Place  O2(2L) Continent, Indwelling catheter Weight:   Height:     BEHAVIORAL SYMPTOMS/MOOD NEUROLOGICAL BOWEL NUTRITION STATUS      Continent Diet(Carb Modified. )  AMBULATORY STATUS COMMUNICATION OF NEEDS Skin   Extensive Assist Verbally Other (Comment)(Surgical Incision )                       Personal Care Assistance Level of Assistance  Bathing, Feeding, Dressing Bathing Assistance: Limited assistance Feeding assistance: Independent Dressing Assistance: Limited assistance     Functional Limitations Info  Sight, Hearing, Speech Sight Info: Adequate Hearing Info: Adequate Speech Info: Adequate    SPECIAL CARE FACTORS FREQUENCY  PT (By licensed PT), OT (By licensed OT)     PT Frequency: 5x/week OT Frequency: 5x/week            Contractures Contractures Info: Not present    Additional Factors Info  Insulin Sliding Scale Code Status Info: Fullcode Allergies Info: Allergies: Fosamax Alendronate Sodium   Insulin Sliding Scale Info: 0-9 Units, every 4 hours       Current  Medications (11/03/2017):  This is the current hospital active medication list Current Facility-Administered Medications  Medication Dose Route Frequency Provider Last Rate Last Dose  . acetaminophen (TYLENOL) tablet 650 mg  650 mg Oral Q4H PRN Yolonda Kidaogers, Jason Patrick, MD   650 mg at 11/02/17 1724   Or  . acetaminophen (TYLENOL) suppository 650 mg  650 mg Rectal Q4H PRN Yolonda Kidaogers, Jason Patrick, MD      . aspirin EC tablet 81 mg  81 mg Oral BID Yolonda Kidaogers, Jason Patrick, MD   81 mg at 11/03/17 1011  . docusate sodium (COLACE) capsule 100 mg  100 mg Oral BID Tarry Kosavid, Rachal A, MD   100 mg at 11/03/17 1012  . enalapril (VASOTEC) tablet 5 mg  5 mg Oral Daily Tarry Kosavid, Rachal A, MD   5 mg at 11/03/17 1011  . furosemide (LASIX) tablet 20 mg  20 mg Oral Daily Tarry Kosavid, Rachal A, MD   20 mg at 11/03/17 1011  . HYDROcodone-acetaminophen (NORCO/VICODIN) 5-325 MG per tablet 1-2 tablet  1-2 tablet Oral Q6H PRN Haydee Monicaavid, Rachal A, MD   1 tablet at 11/03/17 0542  . insulin aspart (novoLOG) injection 0-9 Units  0-9 Units Subcutaneous Q4H Haydee Monicaavid, Rachal A, MD   1 Units at 11/03/17 0800  . labetalol (NORMODYNE) tablet 100 mg  100 mg Oral BID Tarry Kosavid, Rachal A, MD   100 mg at 11/03/17 1012  . methocarbamol (ROBAXIN) tablet 500 mg  500 mg Oral  Q6H PRN Haydee Monicaavid, Rachal A, MD   500 mg at 11/02/17 1724   Or  . methocarbamol (ROBAXIN) 500 mg in dextrose 5 % 50 mL IVPB  500 mg Intravenous Q6H PRN Haydee Monicaavid, Rachal A, MD      . metoCLOPramide (REGLAN) tablet 5-10 mg  5-10 mg Oral Q8H PRN Yolonda Kidaogers, Jason Patrick, MD       Or  . metoCLOPramide (REGLAN) injection 5-10 mg  5-10 mg Intravenous Q8H PRN Yolonda Kidaogers, Jason Patrick, MD      . morphine 2 MG/ML injection 0.5 mg  0.5 mg Intravenous Q2H PRN Haydee Monicaavid, Rachal A, MD      . ondansetron [facility](ZOFRAN) tablet 4 mg  4 mg Oral Q6H PRN Yolonda Kidaogers, Jason Patrick, MD       Or  . ondansetron [facility](ZOFRAN) injection 4 mg  4 mg Intravenous Q6H PRN Yolonda Kidaogers, Jason Patrick, MD      . senna [facility](SENOKOT) tablet 8.6 mg  1 tablet Oral BID  Yolonda Kidaogers, Jason Patrick, MD   8.6 mg at 11/03/17 1011  . simvastatin (ZOCOR) tablet 10 mg  10 mg Oral q1800 Haydee Monicaavid, Rachal A, MD   10 mg at 11/02/17 1724     Discharge Medications: Please see discharge summary for a list of discharge medications.  Relevant Imaging Results:  Relevant Lab Results:   Additional Information SSN: [id number]  Clearance CootsNicole A Lawerence Dery, LCSW

## 2017-11-03 NOTE — Progress Notes (Addendum)
CSW following to assist with discharge [facility]. PASRR pending manuel review Level II.  Physician informed. Patient cannot d/c without approval from the state.   Vivi BarrackNicole Noelle Sease, Theresia MajorsLCSWA, MSW Clinical Social Worker  [phone] 11/03/2017  10:56 AM

## 2017-11-03 NOTE — Progress Notes (Signed)
   Subjective:  Patient reports pain as mild.  Patient states she has had a great day.  Looking forward to going home when cleared by therapy.  Objective:   VITALS:   Vitals:   11/03/17 0328 11/03/17 0903 11/03/17 0924 11/03/17 1154  BP: (!) 120/41 127/66 112/61 (!) 118/59  Pulse: 91 93 87 95  Resp: 15 16 15 16   Temp: 98.4 F (36.9 C) 98.7 F (37.1 C) 98.6 F (37 C) 99 F (37.2 C)  TempSrc: Oral Oral Oral Oral  SpO2: 100% 100% 100% 100%    Neurovascular intact Sensation intact distally Intact pulses distally Dorsiflexion/Plantar flexion intact Incision: dressing C/D/I   Lab Results  Component Value Date   WBC 7.5 11/03/2017   HGB 8.0 (L) 11/03/2017   HCT 24.0 (L) 11/03/2017   MCV 94.1 11/03/2017   PLT 160 11/03/2017   BMET    Component Value Date/Time   NA 139 11/02/2017 0413   K 4.4 11/02/2017 0413   CL 110 11/02/2017 0413   CO2 24 11/02/2017 0413   GLUCOSE 217 (H) 11/02/2017 0413   BUN 23 (H) 11/02/2017 0413   CREATININE 0.87 11/02/2017 0413   CALCIUM 8.2 (L) 11/02/2017 0413   GFRNONAA >60 11/02/2017 0413   GFRAA >60 11/02/2017 0413     Assessment/Plan: 2 Days Post-Op   Principal Problem:   Hip fracture (HCC) Active Problems:   Prediabetes   Fall at nursing home   Up with therapy Weightbearing as tolerated to right lower extremity. Maintain postoperative bandages until follow-up with Dr. Aundria Rudogers. SCDs and twice daily aspirin for DVT prophylaxis. Likely discharge [facility].   Yolonda KidaJason Patrick Alexzander Dolinger 11/03/2017, 6:22 PM   Maryan RuedJason P Leiloni Smithers, MD [phone]

## 2017-11-03 NOTE — Progress Notes (Signed)
PROGRESS NOTE  Kim Adams [id number] DOB: April 24, 1941 DOA: 11/01/2017 PCP: Merlene LaughterStoneking, Hal, MD  HPI/Recap of past 24 hours: Kim Adams is a 76 y.o. female with medical history significant of htn, diet controlled type 2 dm from independent living comes in after falling on some rocks while taking her morning walk at her assisted living.  She could not get up due to right leg pain.  She seems to have some mild dementia, but not listed on her records with hardly no records at all from assisted living. Right hip fracture on xray. Surgery contacted POD#2 post fixation of right closed hip fracture 11/01/17  Seen and examined. Hg dropped to 6.3. Transfusing 1U PRBC. Denies pain, dyspnea or palpitations.   Assessment/Plan: Principal Problem:   Hip fracture (HCC) Active Problems:   Prediabetes   Fall at nursing home   POD#2 post fixation of right closed hip fracture, poa 2/2 mechanical fall  Ortho following Pain management in place Bowel regimen due to use of opiates PT per ortho IV fluid hydration CBC am  Non insulin dependent type 2 diabetes diet controlled A1C 7.5 ISS with hypoglycemia protocol Diabetic cardiac diet  HTN No acute issues labetalol po 100 mg BID  Chronic normocytic anemia vs acute blood loss from surgery Has never had a colonoscopy-states she has always refused it hg 6.3 from 7.3 1UPRBC transfused 11/03/17  Ambulatory dysfunction PT evaluate and treat  HLD simvastatin   DVT prophylaxis:  scds Code Status:  full Family Communication:  none at bedside Disposition Plan: will stay another midnight to continue current management. Placement to SNF for physical rehab Consults called:  Ortho      Objective: Vitals:   11/03/17 0328 11/03/17 0903 11/03/17 0924 11/03/17 1154  BP: (!) 120/41 127/66 112/61 (!) 118/59  Pulse: 91 93 87 95  Resp: 15 16 15 16   Temp: 98.4 F (36.9 C) 98.7 F (37.1 C) 98.6 F (37 C) 99 F (37.2 C)  TempSrc: Oral Oral  Oral Oral  SpO2: 100% 100% 100% 100%    Intake/Output Summary (Last 24 hours) at 11/03/2017 1713 Last data filed at 11/03/2017 1154 Gross per 24 hour  Intake 870 ml  Output 0 ml  Net 870 ml   There were no vitals filed for this visit.  Exam:  Eyes: PERRL, lids and conjunctivae normal ENMT: Mucous membranes are moist. Posterior pharynx clear of any exudate or lesions.Normal dentition.  Neck: normal, supple, no masses, no thyromegaly Respiratory: clear to auscultation bilaterally, no wheezing, no crackles. Normal respiratory effort. No accessory muscle use.  Cardiovascular: Regular rate and rhythm, no murmurs / rubs / gallops. No extremity edema. 2+ pedal pulses. No carotid bruits.  Abdomen: no tenderness, no masses palpated. No hepatosplenomegaly. Bowel sounds positive.  Musculoskeletal: site of incision lateral right hip appears clean with no bleeding or drainage. Surgical draining in place. Skin: as stated above Neurologic: non focal  Psychiatric: Mood appropriate for condition and setting   Data Reviewed: CBC: Recent Labs  Lab 11/01/17 0736 11/02/17 0413 11/03/17 0437 11/03/17 1259  WBC 12.7* 6.6 7.5  --   NEUTROABS 10.4*  --   --   --   HGB 10.2* 7.4* 6.3* 8.0*  HCT 30.9* 22.6* 19.3* 24.0*  MCV 92.2 93.8 94.1  --   PLT 228 188 160  --    Basic Metabolic Panel: Recent Labs  Lab 11/01/17 0736 11/01/17 1317 11/02/17 0413  NA 141  --  139  K 4.7  --  4.4  CL 110  --  110  CO2 24  --  24  GLUCOSE 170*  --  217*  BUN 30*  --  23*  CREATININE 0.87  --  0.87  CALCIUM 9.1  --  8.2*  MG  --  1.6*  --    GFR: CrCl cannot be calculated (Unknown ideal weight.). Liver Function Tests: No results for input(s): AST, ALT, ALKPHOS, BILITOT, PROT, ALBUMIN in the last 168 hours. No results for input(s): LIPASE, AMYLASE in the last 168 hours. No results for input(s): AMMONIA in the last 168 hours. Coagulation Profile: Recent Labs  Lab 11/01/17 1317  INR 1.10    Cardiac Enzymes: No results for input(s): CKTOTAL, CKMB, CKMBINDEX, TROPONINI in the last 168 hours. BNP (last 3 results) No results for input(s): PROBNP in the last 8760 hours. HbA1C: Recent Labs    11/01/17 1317  HGBA1C 7.5*   CBG: Recent Labs  Lab 11/02/17 2344 11/03/17 0324 11/03/17 0754 11/03/17 1205 11/03/17 1608  GLUCAP 119* 150* 133* 171* 235*   Lipid Profile: No results for input(s): CHOL, HDL, LDLCALC, TRIG, CHOLHDL, LDLDIRECT in the last 72 hours. Thyroid Function Tests: No results for input(s): TSH, T4TOTAL, FREET4, T3FREE, THYROIDAB in the last 72 hours. Anemia Panel: No results for input(s): VITAMINB12, FOLATE, FERRITIN, TIBC, IRON, RETICCTPCT in the last 72 hours. Urine analysis:    Component Value Date/Time   COLORURINE YELLOW 11/03/2017 0646   APPEARANCEUR CLEAR 11/03/2017 0646   LABSPEC 1.023 11/03/2017 0646   PHURINE 5.0 11/03/2017 0646   GLUCOSEU 50 (A) 11/03/2017 0646   HGBUR NEGATIVE 11/03/2017 0646   BILIRUBINUR NEGATIVE 11/03/2017 0646   KETONESUR 5 (A) 11/03/2017 0646   PROTEINUR NEGATIVE 11/03/2017 0646   NITRITE NEGATIVE 11/03/2017 0646   LEUKOCYTESUR NEGATIVE 11/03/2017 0646   Sepsis Labs: @LABRCNTIP (procalcitonin:4,lacticidven:4)  )No results found for this or any previous visit (from the past 240 hour(s)).    Studies: No results found.  Scheduled Meds: . aspirin EC  81 mg Oral BID  . docusate sodium  100 mg Oral BID  . enalapril  5 mg Oral Daily  . furosemide  20 mg Oral Daily  . insulin aspart  0-9 Units Subcutaneous Q4H  . labetalol  100 mg Oral BID  . senna  1 tablet Oral BID  . simvastatin  10 mg Oral q1800    Continuous Infusions: . methocarbamol (ROBAXIN)  IV       LOS: 2 days     Darlin Droparole N Kiera Hussey, MD [facility] Pager [phone]  If 7PM-7AM, please contact night-coverage [email or web address] Password [email or web address] 11/03/2017, 5:13 PM

## 2017-11-04 DIAGNOSIS — E785 Hyperlipidemia, unspecified: Secondary | ICD-10-CM | POA: Diagnosis not present

## 2017-11-04 DIAGNOSIS — E119 Type 2 diabetes mellitus without complications: Secondary | ICD-10-CM

## 2017-11-04 DIAGNOSIS — G8911 Acute pain due to trauma: Secondary | ICD-10-CM | POA: Diagnosis not present

## 2017-11-04 DIAGNOSIS — R279 Unspecified lack of coordination: Secondary | ICD-10-CM | POA: Diagnosis not present

## 2017-11-04 DIAGNOSIS — S72009A Fracture of unspecified part of neck of unspecified femur, initial encounter for closed fracture: Secondary | ICD-10-CM

## 2017-11-04 DIAGNOSIS — E039 Hypothyroidism, unspecified: Secondary | ICD-10-CM | POA: Diagnosis not present

## 2017-11-04 DIAGNOSIS — K5900 Constipation, unspecified: Secondary | ICD-10-CM | POA: Diagnosis not present

## 2017-11-04 DIAGNOSIS — Z4789 Encounter for other orthopedic aftercare: Secondary | ICD-10-CM | POA: Diagnosis not present

## 2017-11-04 DIAGNOSIS — I10 Essential (primary) hypertension: Secondary | ICD-10-CM | POA: Diagnosis not present

## 2017-11-04 DIAGNOSIS — E569 Vitamin deficiency, unspecified: Secondary | ICD-10-CM | POA: Diagnosis not present

## 2017-11-04 DIAGNOSIS — R52 Pain, unspecified: Secondary | ICD-10-CM | POA: Diagnosis not present

## 2017-11-04 DIAGNOSIS — S72141D Displaced intertrochanteric fracture of right femur, subsequent encounter for closed fracture with routine healing: Secondary | ICD-10-CM | POA: Diagnosis not present

## 2017-11-04 DIAGNOSIS — Z111 Encounter for screening for respiratory tuberculosis: Secondary | ICD-10-CM | POA: Diagnosis not present

## 2017-11-04 DIAGNOSIS — E1122 Type 2 diabetes mellitus with diabetic chronic kidney disease: Secondary | ICD-10-CM

## 2017-11-04 DIAGNOSIS — Z9181 History of falling: Secondary | ICD-10-CM | POA: Diagnosis not present

## 2017-11-04 DIAGNOSIS — D500 Iron deficiency anemia secondary to blood loss (chronic): Secondary | ICD-10-CM | POA: Diagnosis not present

## 2017-11-04 DIAGNOSIS — S72001A Fracture of unspecified part of neck of right femur, initial encounter for closed fracture: Secondary | ICD-10-CM | POA: Diagnosis not present

## 2017-11-04 DIAGNOSIS — M6281 Muscle weakness (generalized): Secondary | ICD-10-CM | POA: Diagnosis not present

## 2017-11-04 DIAGNOSIS — R2689 Other abnormalities of gait and mobility: Secondary | ICD-10-CM | POA: Diagnosis not present

## 2017-11-04 DIAGNOSIS — S72001D Fracture of unspecified part of neck of right femur, subsequent encounter for closed fracture with routine healing: Secondary | ICD-10-CM | POA: Diagnosis not present

## 2017-11-04 LAB — TYPE AND SCREEN
ABO/RH(D): A POS
ANTIBODY SCREEN: NEGATIVE
UNIT DIVISION: 0

## 2017-11-04 LAB — BPAM RBC
BLOOD PRODUCT EXPIRATION DATE: 201901232359
ISSUE DATE / TIME: 201812270851
UNIT TYPE AND RH: 6200

## 2017-11-04 LAB — GLUCOSE, CAPILLARY
GLUCOSE-CAPILLARY: 130 mg/dL — AB (ref 65–99)
GLUCOSE-CAPILLARY: 142 mg/dL — AB (ref 65–99)
GLUCOSE-CAPILLARY: 148 mg/dL — AB (ref 65–99)
Glucose-Capillary: 142 mg/dL — ABNORMAL HIGH (ref 65–99)

## 2017-11-04 MED ORDER — INSULIN ASPART 100 UNIT/ML ~~LOC~~ SOLN: 0.0000 [IU] | Freq: Three times a day (TID) | SUBCUTANEOUS | Status: DC

## 2017-11-04 MED ORDER — SIMVASTATIN 10 MG PO TABS: 10.0000 mg | ORAL_TABLET | Freq: Every day | ORAL | 0 refills | Status: DC

## 2017-11-04 NOTE — Progress Notes (Signed)
Pt is [facility] [place]. Report was called to [place] at [phone]. Full report was given and all questions were answered.

## 2017-11-04 NOTE — Clinical Social Work Placement (Signed)
D/C Summary sent.  PASRR received.  PTAR scheduled for 3:30pm Nurse given number for report.   CLINICAL SOCIAL WORK PLACEMENT  NOTE  Date:  11/04/2017  Patient Details  Name: Kim HoppingJoann Dohn MRN: [id number] Date of Birth: Nov 17, 1940  Clinical Social Work is seeking post-discharge placement for this patient at the Skilled  Nursing Facility level of care (*CSW will initial, date and re-position this form in  chart as items are completed):  No   Patient/family provided with [facility] Clinical Social Work Department's list of facilities offering this level of care within the geographic area requested by the patient (or if unable, by the patient's family).  No   Patient/family informed of their freedom to choose among providers that offer the needed level of care, that participate in Medicare, Medicaid or managed care program needed by the patient, have an available bed and are willing to accept the patient.  No   Patient/family informed of [facility]'s ownership interest in [facility] and [facility], as well as of the fact that they are under no obligation to receive care at these facilities.  PASRR submitted to EDS on 11/03/17     PASRR number received on 11/04/17     Existing PASRR number confirmed on       FL2 transmitted to all facilities in geographic area requested by pt/family on       FL2 transmitted to all facilities within larger geographic area on       Patient informed that his/her managed care company has contracts with or will negotiate with certain facilities, including the following:            Patient/family informed of bed offers received.  [facility]     Physician recommends and patient chooses bed at      [facility] 11[facility]04[facility].  Patient [facility] be [facility] facility [facility]     Patient family notified on 11/04/17 of transfer.  Name of family member notified:  Sister Nancey       PHYSICIAN       Additional Comment:    _______________________________________________ Clearance CootsNicole A Edell Mesenbrink, LCSW 11/04/2017, 1:38 PM

## 2017-11-04 NOTE — Care Management Important Message (Signed)
Important Message  Patient Details  Name: Kim Adams MRN: [id number] Date of Birth: 1941/08/27   Medicare Important Message Given:  Yes    Caren MacadamFuller, Sheza Strickland 11/04/2017, 9:56 AMImportant Message  Patient Details  Name: Kim Adams MRN: [id number] Date of Birth: 1941/08/27   Medicare Important Message Given:  Yes    Caren MacadamFuller, Roselyn Doby 11/04/2017, 9:56 AM

## 2017-11-04 NOTE — Discharge Summary (Signed)
Physician Discharge Summary  Kim Adams [id number] DOB: 09-23-1941 DOA: 11/01/2017  PCP: Kim LaughterStoneking, Hal, MD  Admit date: 11/01/2017 Discharge date: 11/04/2017  Admitted From: Kim Adams Disposition:  [name]  Recommendations for Outpatient Follow-up:  1. Follow up with PCP in 1-2 weeks 2. Please obtain BMP/CBC in one week    Discharge Condition: Stable CODE STATUS: FULL Diet recommendation: Heart Healthy / Carb Modified    Brief/Interim Summary: 76 y.o.femalewith medical history significant ofhtn,type 2 dm from independent living comes in after falling on some rocks while taking her morning walk at her assisted living. She could not get up due to right leg pain. She seems to have undocumented cognitive impairment not listed on her records with hardly no records at all from assisted living.  Right displaced femoral intertrochanteric fracture on xray.  Orthopedic surgery was consulted.  The patient was taken to surgery by Dr. Duwayne HeckJason Rogers on November 01, 2017 where an intramedullary implant was placed.  Physical therapy was consulted and recommended skilled nursing facility.  The patient and family want to be discharged [facility].  The patient did have acute blood loss anemia with hemoglobin dropping from 10.2-6.3.  She was transfused with 1 unit PRBC with improvement to 8.0.    Discharge Diagnoses:  Right femoral intertrochanteric fracture -Secondary to mechanical fall -Appreciate orthopedics -11/01/2017--operative repair--intramedullary implant -PT evaluation--SNF -Pain control -Bowel regimen -Aspirin 81 mg twice daily per orthopedics for DVT prophylaxis  Diabetes mellitus type 2 -11/01/2017 hemoglobin A1c--7.5 -Resume metformin after discharge  Acute blood loss anemia -Secondary to femoral fracture and surgery -Transfuse 1 unit PRBC 11/03/2017 -Hemoglobin 8.0 at the time of discharge  Hyperlipidemia -Continue  statin  Essential hypertension -Controlled -Continue labetalol and enalapril   Discharge Instructions  Discharge Instructions    Diet Carb Modified   Complete by:  As directed    Increase activity slowly   Complete by:  As directed      Allergies as of 11/04/2017      Reactions   Fosamax [alendronate Sodium] Other (See Comments)   Esophageal problems      Medication List    TAKE these medications   aspirin EC 81 MG tablet Take 1 tablet (81 mg total) by mouth 2 (two) times daily. What changed:  when to take this   CALCIUM/VITAMIN D/MINERALS 600-200 MG-UNIT Tabs Take 1 tablet by mouth daily.   enalapril 5 MG tablet Commonly known as:  VASOTEC Take 5 mg by mouth daily.   ferrous sulfate 325 (65 FE) MG tablet Take 325 mg by mouth every Monday, Wednesday, and Friday.   furosemide 20 MG tablet Commonly known as:  LASIX Take 20 mg by mouth daily.   HYDROcodone-acetaminophen 5-325 MG tablet Commonly known as:  NORCO/VICODIN Take 1-2 tablets by mouth every 6 (six) hours as needed for moderate pain.   labetalol 100 MG tablet Commonly known as:  NORMODYNE Take 100 mg by mouth 2 (two) times daily.   metFORMIN 850 MG tablet Commonly known as:  GLUCOPHAGE Take 850 mg by mouth daily with breakfast.   simvastatin 10 MG tablet Commonly known as:  ZOCOR Take 10 mg by mouth daily. What changed:  Another medication with the same name was changed. Make sure you understand how and when to take each.   simvastatin 10 MG tablet Commonly known as:  ZOCOR Take 1 tablet (10 mg total) by mouth daily at 6 PM. What changed:    medication strength  how much to take  when to take this   sitaGLIPtin 100 MG tablet Commonly known as:  JANUVIA Take 100 mg by mouth daily.       Contact information for follow-up providers    Yolonda Kida, MD. Schedule an appointment as soon as possible for a visit in 3 weeks.   Specialty:  Orthopedic Surgery Why:  For wound  re-check, For suture removal Contact information: [address] [place] [address] [phone]            Contact information for after-discharge care    Destination    [name] .   Service:  Skilled Nursing Contact information: [address] [place] [address] [phone]                 Allergies  Allergen Reactions  . Fosamax [Alendronate Sodium] Other (See Comments)    Esophageal problems    Consultations:  Ortho--Dr. Aundria Rud   Procedures/Studies: Dg Chest 1 View  Result Date: 11/01/2017 CLINICAL DATA:  Fall, right hip fracture. EXAM: CHEST 1 VIEW COMPARISON:  None. FINDINGS: Heart is borderline in size. Mild hyperinflation of the lungs. No confluent opacities or effusions. No acute bony abnormality. IMPRESSION: Hyperinflation.  No active disease. Electronically Signed   By: Charlett Nose M.D.   On: 11/01/2017 08:32   Dg C-arm 1-60 Min-no Report  Result Date: 11/01/2017 Fluoroscopy was utilized by the requesting physician.  No radiographic interpretation.   Dg Hip Operative Unilat With Pelvis Right  Result Date: 11/01/2017 CLINICAL DATA:  Hip fracture EXAM: OPERATIVE RIGHT HIP (WITH PELVIS IF PERFORMED) 4 VIEWS TECHNIQUE: Fluoroscopic spot image(s) were submitted for interpretation post-operatively. COMPARISON:  Hip films earlier today FINDINGS: Four intraoperative spot images demonstrate internal fixation across the right femoral intertrochanteric fracture. Near anatomic alignment. No hardware complicating feature. IMPRESSION: Internal fixation.  No complicating feature. Electronically Signed   By: Charlett Nose M.D.   On: 11/01/2017 11:51   Dg Hip Unilat W Or Wo Pelvis 2-3 Views Right  Result Date: 11/01/2017 CLINICAL DATA:  Fall, right hip EXAM: DG HIP (WITH OR WITHOUT PELVIS) 2-3V RIGHT COMPARISON:  None. FINDINGS: There is a right femoral intertrochanteric fracture with displacement of the lesser trochanter  and varus angulation. No subluxation or dislocation. IMPRESSION: Displaced right femoral intertrochanteric fracture with varus angulation. Electronically Signed   By: Charlett Nose M.D.   On: 11/01/2017 08:31        Discharge Exam: Vitals:   11/03/17 2338 11/04/17 0540  BP: 130/77 121/72  Pulse: 89 88  Resp:  16  Temp:  99.2 F (37.3 C)  SpO2:  100%   Vitals:   11/03/17 1154 11/03/17 2135 11/03/17 2338 11/04/17 0540  BP: (!) 118/59 (!) 126/53 130/77 121/72  Pulse: 95 93 89 88  Resp: 16 16  16   Temp: 99 F (37.2 C) 99.3 F (37.4 C)  99.2 F (37.3 C)  TempSrc: Oral Oral  Oral  SpO2: 100% 100%  100%    General: Pt is alert, awake, not in acute distress Cardiovascular: RRR, S1/S2 +, no rubs, no gallops Respiratory: CTA bilaterally, no wheezing, no rhonchi Abdominal: Soft, NT, ND, bowel sounds + Extremities: no edema, no cyanosis   The results of significant diagnostics from this hospitalization (including imaging, microbiology, ancillary and laboratory) are listed below for reference.    Significant Diagnostic Studies: Dg Chest 1 View  Result Date: 11/01/2017 CLINICAL DATA:  Fall, right hip fracture. EXAM: CHEST 1 VIEW COMPARISON:  None. FINDINGS:  Heart is borderline in size. Mild hyperinflation of the lungs. No confluent opacities or effusions. No acute bony abnormality. IMPRESSION: Hyperinflation.  No active disease. Electronically Signed   By: Charlett NoseKevin  Dover M.D.   On: 11/01/2017 08:32   Dg C-arm 1-60 Min-no Report  Result Date: 11/01/2017 Fluoroscopy was utilized by the requesting physician.  No radiographic interpretation.   Dg Hip Operative Unilat With Pelvis Right  Result Date: 11/01/2017 CLINICAL DATA:  Hip fracture EXAM: OPERATIVE RIGHT HIP (WITH PELVIS IF PERFORMED) 4 VIEWS TECHNIQUE: Fluoroscopic spot image(s) were submitted for interpretation post-operatively. COMPARISON:  Hip films earlier today FINDINGS: Four intraoperative spot images demonstrate internal  fixation across the right femoral intertrochanteric fracture. Near anatomic alignment. No hardware complicating feature. IMPRESSION: Internal fixation.  No complicating feature. Electronically Signed   By: Charlett NoseKevin  Dover M.D.   On: 11/01/2017 11:51   Dg Hip Unilat W Or Wo Pelvis 2-3 Views Right  Result Date: 11/01/2017 CLINICAL DATA:  Fall, right hip EXAM: DG HIP (WITH OR WITHOUT PELVIS) 2-3V RIGHT COMPARISON:  None. FINDINGS: There is a right femoral intertrochanteric fracture with displacement of the lesser trochanter and varus angulation. No subluxation or dislocation. IMPRESSION: Displaced right femoral intertrochanteric fracture with varus angulation. Electronically Signed   By: Charlett NoseKevin  Dover M.D.   On: 11/01/2017 08:31     Microbiology: No results found for this or any previous visit (from the past 240 hour(s)).   Labs: Basic Metabolic Panel: Recent Labs  Lab 11/01/17 0736 11/01/17 1317 11/02/17 0413  NA 141  --  139  K 4.7  --  4.4  CL 110  --  110  CO2 24  --  24  GLUCOSE 170*  --  217*  BUN 30*  --  23*  CREATININE 0.87  --  0.87  CALCIUM 9.1  --  8.2*  MG  --  1.6*  --    Liver Function Tests: No results for input(s): AST, ALT, ALKPHOS, BILITOT, PROT, ALBUMIN in the last 168 hours. No results for input(s): LIPASE, AMYLASE in the last 168 hours. No results for input(s): AMMONIA in the last 168 hours. CBC: Recent Labs  Lab 11/01/17 0736 11/02/17 0413 11/03/17 0437 11/03/17 1259  WBC 12.7* 6.6 7.5  --   NEUTROABS 10.4*  --   --   --   HGB 10.2* 7.4* 6.3* 8.0*  HCT 30.9* 22.6* 19.3* 24.0*  MCV 92.2 93.8 94.1  --   PLT 228 188 160  --    Cardiac Enzymes: No results for input(s): CKTOTAL, CKMB, CKMBINDEX, TROPONINI in the last 168 hours. BNP: Invalid input(s): POCBNP CBG: Recent Labs  Lab 11/03/17 2012 11/04/17 0040 11/04/17 0424 11/04/17 0743 11/04/17 1237  GLUCAP 157* 130* 148* 142* 142*    Time coordinating discharge:  Greater than 30  minutes  Signed:  Catarina Hartshornavid Ezana Hubbert, DO [facility] Pager: [phone] 11/04/2017, 12:45 PM

## 2017-11-04 NOTE — Clinical Social Work Note (Signed)
Clinical Social Work Assessment  Patient Details  Name: Kim Adams MRN: [id number] Date of Birth: [birth year]  Date of referral:  11/03/17               Reason for consult:  Facility Placement                Permission sought to share information with:  Family Supports Permission granted to share information::  Yes, Verbal Permission Granted  Name::        Agency::  [employer]   Relationship::  [place]:     Housing/Transportation Living arrangements for the past 2 months:  Charity fundraiser of Information:  Patient Patient Interpreter Needed:  None Criminal Activity/Legal Involvement Pertinent to Current Situation/Hospitalization:  No - Comment as needed Significant Relationships:  Siblings Lives with:  Facility Resident Do you feel safe going back to the place where you live?  Yes Need for family participation in patient care:  Yes (Dependent w/mobility)   Care giving concerns: Patient lives at [facility]. She fell on some rocks while taking her morning and could not get due to right leg pain. Patient agreeable to surgical intervention.     Social Worker assessment / plan: CSW met with patient at bedside, explain role and reason for visit- to assist with transition to [facility] for short rehab before returning to independent living. CSW started PASRR process, the pasrr is a level II. CSW in discussion about bed placement with facility liaison [place].  Patient insisted CSW talk with her sister about her discharge plan. CSW contacted her sister/POA sister Kim Adams and discussed d/c plan.   Plan: Assist with discharge to SNF for rehab once state PASRR number is received.  Employment status:   Retired  Nurse, adult PT Recommendations:    Information / Referral to community resources:  [place]  Patient/Family's Response to care: Agreeable to SNF placement  and Responding well to provided care.   Patient/Family's Understanding of and Emotional Response to Diagnosis, Current Treatment, and Prognosis: Patient is forgetful and show signs of dementia. Patient sister Kim Adams very involeved and knowledgeable about pt. Diagnosis and care. She lives out town but has visited the patient at hospital . She plans to follow up with the patient once at the SNF.   Emotional Assessment Appearance:  Developmentally appropriate Attitude/Demeanor/Rapport:    Affect (typically observed):  Accepting, Pleasant Orientation:  Oriented to Self, Oriented to Place, Oriented to  Time, Oriented to Situation Alcohol / Substance use:  Not Applicable Psych involvement (Current and /or in the community):  No (Comment)  Discharge Needs  Concerns to be addressed:  Discharge Planning Concerns Readmission within the last 30 days:  No Current discharge risk:  Dependent with Mobility Barriers to Discharge:  Continued Medical Work up   [employer], LCSW 11/04/2017, 10:33 AM

## 2017-11-07 DIAGNOSIS — E119 Type 2 diabetes mellitus without complications: Secondary | ICD-10-CM | POA: Diagnosis not present

## 2017-11-07 DIAGNOSIS — R52 Pain, unspecified: Secondary | ICD-10-CM | POA: Diagnosis not present

## 2017-11-07 DIAGNOSIS — S72001D Fracture of unspecified part of neck of right femur, subsequent encounter for closed fracture with routine healing: Secondary | ICD-10-CM | POA: Diagnosis not present

## 2017-11-07 DIAGNOSIS — M6281 Muscle weakness (generalized): Secondary | ICD-10-CM | POA: Diagnosis not present

## 2017-11-09 DIAGNOSIS — R52 Pain, unspecified: Secondary | ICD-10-CM | POA: Diagnosis not present

## 2017-11-09 DIAGNOSIS — S72001D Fracture of unspecified part of neck of right femur, subsequent encounter for closed fracture with routine healing: Secondary | ICD-10-CM | POA: Diagnosis not present

## 2017-11-09 DIAGNOSIS — M6281 Muscle weakness (generalized): Secondary | ICD-10-CM | POA: Diagnosis not present

## 2017-11-09 DIAGNOSIS — E119 Type 2 diabetes mellitus without complications: Secondary | ICD-10-CM | POA: Diagnosis not present

## 2017-11-10 DIAGNOSIS — S72001A Fracture of unspecified part of neck of right femur, initial encounter for closed fracture: Secondary | ICD-10-CM | POA: Diagnosis not present

## 2017-11-10 DIAGNOSIS — M6281 Muscle weakness (generalized): Secondary | ICD-10-CM | POA: Diagnosis not present

## 2017-11-10 DIAGNOSIS — R52 Pain, unspecified: Secondary | ICD-10-CM | POA: Diagnosis not present

## 2017-11-11 DIAGNOSIS — R52 Pain, unspecified: Secondary | ICD-10-CM | POA: Diagnosis not present

## 2017-11-11 DIAGNOSIS — D500 Iron deficiency anemia secondary to blood loss (chronic): Secondary | ICD-10-CM | POA: Diagnosis not present

## 2017-11-11 DIAGNOSIS — M6281 Muscle weakness (generalized): Secondary | ICD-10-CM | POA: Diagnosis not present

## 2017-11-11 DIAGNOSIS — S72001D Fracture of unspecified part of neck of right femur, subsequent encounter for closed fracture with routine healing: Secondary | ICD-10-CM | POA: Diagnosis not present

## 2017-11-15 DIAGNOSIS — S72001D Fracture of unspecified part of neck of right femur, subsequent encounter for closed fracture with routine healing: Secondary | ICD-10-CM | POA: Diagnosis not present

## 2017-11-15 DIAGNOSIS — M6281 Muscle weakness (generalized): Secondary | ICD-10-CM | POA: Diagnosis not present

## 2017-11-15 DIAGNOSIS — R52 Pain, unspecified: Secondary | ICD-10-CM | POA: Diagnosis not present

## 2017-11-15 DIAGNOSIS — I10 Essential (primary) hypertension: Secondary | ICD-10-CM | POA: Diagnosis not present

## 2017-11-21 DIAGNOSIS — M6281 Muscle weakness (generalized): Secondary | ICD-10-CM | POA: Diagnosis not present

## 2017-11-21 DIAGNOSIS — D500 Iron deficiency anemia secondary to blood loss (chronic): Secondary | ICD-10-CM | POA: Diagnosis not present

## 2017-11-21 DIAGNOSIS — R52 Pain, unspecified: Secondary | ICD-10-CM | POA: Diagnosis not present

## 2017-11-21 DIAGNOSIS — S72001D Fracture of unspecified part of neck of right femur, subsequent encounter for closed fracture with routine healing: Secondary | ICD-10-CM | POA: Diagnosis not present

## 2017-11-25 DIAGNOSIS — S72001D Fracture of unspecified part of neck of right femur, subsequent encounter for closed fracture with routine healing: Secondary | ICD-10-CM | POA: Diagnosis not present

## 2017-11-25 DIAGNOSIS — D500 Iron deficiency anemia secondary to blood loss (chronic): Secondary | ICD-10-CM | POA: Diagnosis not present

## 2017-11-25 DIAGNOSIS — M6281 Muscle weakness (generalized): Secondary | ICD-10-CM | POA: Diagnosis not present

## 2017-11-25 DIAGNOSIS — R52 Pain, unspecified: Secondary | ICD-10-CM | POA: Diagnosis not present

## 2017-11-29 DIAGNOSIS — S72141D Displaced intertrochanteric fracture of right femur, subsequent encounter for closed fracture with routine healing: Secondary | ICD-10-CM | POA: Diagnosis not present

## 2017-11-29 DIAGNOSIS — R2689 Other abnormalities of gait and mobility: Secondary | ICD-10-CM | POA: Diagnosis not present

## 2017-11-29 DIAGNOSIS — M6281 Muscle weakness (generalized): Secondary | ICD-10-CM | POA: Diagnosis not present

## 2017-11-29 DIAGNOSIS — Z9181 History of falling: Secondary | ICD-10-CM | POA: Diagnosis not present

## 2017-11-30 DIAGNOSIS — S72141D Displaced intertrochanteric fracture of right femur, subsequent encounter for closed fracture with routine healing: Secondary | ICD-10-CM | POA: Diagnosis not present

## 2017-11-30 DIAGNOSIS — R2689 Other abnormalities of gait and mobility: Secondary | ICD-10-CM | POA: Diagnosis not present

## 2017-11-30 DIAGNOSIS — M6281 Muscle weakness (generalized): Secondary | ICD-10-CM | POA: Diagnosis not present

## 2017-11-30 DIAGNOSIS — Z9181 History of falling: Secondary | ICD-10-CM | POA: Diagnosis not present

## 2017-12-01 DIAGNOSIS — M6281 Muscle weakness (generalized): Secondary | ICD-10-CM | POA: Diagnosis not present

## 2017-12-01 DIAGNOSIS — R2689 Other abnormalities of gait and mobility: Secondary | ICD-10-CM | POA: Diagnosis not present

## 2017-12-01 DIAGNOSIS — Z9181 History of falling: Secondary | ICD-10-CM | POA: Diagnosis not present

## 2017-12-01 DIAGNOSIS — S72141D Displaced intertrochanteric fracture of right femur, subsequent encounter for closed fracture with routine healing: Secondary | ICD-10-CM | POA: Diagnosis not present

## 2017-12-02 DIAGNOSIS — S72141D Displaced intertrochanteric fracture of right femur, subsequent encounter for closed fracture with routine healing: Secondary | ICD-10-CM | POA: Diagnosis not present

## 2017-12-05 DIAGNOSIS — S72141D Displaced intertrochanteric fracture of right femur, subsequent encounter for closed fracture with routine healing: Secondary | ICD-10-CM | POA: Diagnosis not present

## 2017-12-06 DIAGNOSIS — S72141D Displaced intertrochanteric fracture of right femur, subsequent encounter for closed fracture with routine healing: Secondary | ICD-10-CM | POA: Diagnosis not present

## 2017-12-07 DIAGNOSIS — S72141D Displaced intertrochanteric fracture of right femur, subsequent encounter for closed fracture with routine healing: Secondary | ICD-10-CM | POA: Diagnosis not present

## 2017-12-08 DIAGNOSIS — S72141D Displaced intertrochanteric fracture of right femur, subsequent encounter for closed fracture with routine healing: Secondary | ICD-10-CM | POA: Diagnosis not present

## 2017-12-09 DIAGNOSIS — M6281 Muscle weakness (generalized): Secondary | ICD-10-CM | POA: Diagnosis not present

## 2017-12-09 DIAGNOSIS — Z9181 History of falling: Secondary | ICD-10-CM | POA: Diagnosis not present

## 2017-12-09 DIAGNOSIS — S72141D Displaced intertrochanteric fracture of right femur, subsequent encounter for closed fracture with routine healing: Secondary | ICD-10-CM | POA: Diagnosis not present

## 2017-12-09 DIAGNOSIS — R2689 Other abnormalities of gait and mobility: Secondary | ICD-10-CM | POA: Diagnosis not present

## 2017-12-12 DIAGNOSIS — S72141D Displaced intertrochanteric fracture of right femur, subsequent encounter for closed fracture with routine healing: Secondary | ICD-10-CM | POA: Diagnosis not present

## 2017-12-13 DIAGNOSIS — S72141D Displaced intertrochanteric fracture of right femur, subsequent encounter for closed fracture with routine healing: Secondary | ICD-10-CM | POA: Diagnosis not present

## 2017-12-14 DIAGNOSIS — S72141D Displaced intertrochanteric fracture of right femur, subsequent encounter for closed fracture with routine healing: Secondary | ICD-10-CM | POA: Diagnosis not present

## 2017-12-15 DIAGNOSIS — S72141D Displaced intertrochanteric fracture of right femur, subsequent encounter for closed fracture with routine healing: Secondary | ICD-10-CM | POA: Diagnosis not present

## 2017-12-16 DIAGNOSIS — S72141D Displaced intertrochanteric fracture of right femur, subsequent encounter for closed fracture with routine healing: Secondary | ICD-10-CM | POA: Diagnosis not present

## 2017-12-19 DIAGNOSIS — S72141D Displaced intertrochanteric fracture of right femur, subsequent encounter for closed fracture with routine healing: Secondary | ICD-10-CM | POA: Diagnosis not present

## 2017-12-20 ENCOUNTER — Other Ambulatory Visit (HOSPITAL_COMMUNITY): Payer: Self-pay | Admitting: Orthopedic Surgery

## 2017-12-20 ENCOUNTER — Ambulatory Visit (HOSPITAL_COMMUNITY)
Admission: RE | Admit: 2017-12-20 | Discharge: 2017-12-20 | Disposition: A | Discharge status: Home / Self Care | Payer: Medicare Other | Source: Ambulatory Visit | Attending: Internal Medicine | Admitting: Internal Medicine

## 2017-12-20 DIAGNOSIS — R609 Edema, unspecified: Secondary | ICD-10-CM | POA: Diagnosis not present

## 2017-12-20 DIAGNOSIS — X58XXXD Exposure to other specified factors, subsequent encounter: Secondary | ICD-10-CM | POA: Diagnosis not present

## 2017-12-20 DIAGNOSIS — Z4789 Encounter for other orthopedic aftercare: Secondary | ICD-10-CM | POA: Diagnosis not present

## 2017-12-20 DIAGNOSIS — S72141D Displaced intertrochanteric fracture of right femur, subsequent encounter for closed fracture with routine healing: Secondary | ICD-10-CM | POA: Diagnosis not present

## 2017-12-21 DIAGNOSIS — S72141D Displaced intertrochanteric fracture of right femur, subsequent encounter for closed fracture with routine healing: Secondary | ICD-10-CM | POA: Diagnosis not present

## 2017-12-22 DIAGNOSIS — S72141D Displaced intertrochanteric fracture of right femur, subsequent encounter for closed fracture with routine healing: Secondary | ICD-10-CM | POA: Diagnosis not present

## 2017-12-23 DIAGNOSIS — S72141D Displaced intertrochanteric fracture of right femur, subsequent encounter for closed fracture with routine healing: Secondary | ICD-10-CM | POA: Diagnosis not present

## 2017-12-26 DIAGNOSIS — S72141D Displaced intertrochanteric fracture of right femur, subsequent encounter for closed fracture with routine healing: Secondary | ICD-10-CM | POA: Diagnosis not present

## 2017-12-27 DIAGNOSIS — S72141D Displaced intertrochanteric fracture of right femur, subsequent encounter for closed fracture with routine healing: Secondary | ICD-10-CM | POA: Diagnosis not present

## 2017-12-30 DIAGNOSIS — S72141D Displaced intertrochanteric fracture of right femur, subsequent encounter for closed fracture with routine healing: Secondary | ICD-10-CM | POA: Diagnosis not present

## 2018-01-02 DIAGNOSIS — S72141D Displaced intertrochanteric fracture of right femur, subsequent encounter for closed fracture with routine healing: Secondary | ICD-10-CM | POA: Diagnosis not present

## 2018-01-04 DIAGNOSIS — S72141D Displaced intertrochanteric fracture of right femur, subsequent encounter for closed fracture with routine healing: Secondary | ICD-10-CM | POA: Diagnosis not present

## 2018-01-06 DIAGNOSIS — M6281 Muscle weakness (generalized): Secondary | ICD-10-CM | POA: Diagnosis not present

## 2018-01-06 DIAGNOSIS — S72141D Displaced intertrochanteric fracture of right femur, subsequent encounter for closed fracture with routine healing: Secondary | ICD-10-CM | POA: Diagnosis not present

## 2018-01-06 DIAGNOSIS — R2689 Other abnormalities of gait and mobility: Secondary | ICD-10-CM | POA: Diagnosis not present

## 2018-01-06 DIAGNOSIS — Z9181 History of falling: Secondary | ICD-10-CM | POA: Diagnosis not present

## 2018-01-09 DIAGNOSIS — S72141D Displaced intertrochanteric fracture of right femur, subsequent encounter for closed fracture with routine healing: Secondary | ICD-10-CM | POA: Diagnosis not present

## 2018-01-11 DIAGNOSIS — S72141D Displaced intertrochanteric fracture of right femur, subsequent encounter for closed fracture with routine healing: Secondary | ICD-10-CM | POA: Diagnosis not present

## 2018-01-12 DIAGNOSIS — S72141D Displaced intertrochanteric fracture of right femur, subsequent encounter for closed fracture with routine healing: Secondary | ICD-10-CM | POA: Diagnosis not present

## 2018-01-12 DIAGNOSIS — R2689 Other abnormalities of gait and mobility: Secondary | ICD-10-CM | POA: Diagnosis not present

## 2018-01-12 DIAGNOSIS — M6281 Muscle weakness (generalized): Secondary | ICD-10-CM | POA: Diagnosis not present

## 2018-01-12 DIAGNOSIS — Z9181 History of falling: Secondary | ICD-10-CM | POA: Diagnosis not present

## 2018-01-16 DIAGNOSIS — S72141D Displaced intertrochanteric fracture of right femur, subsequent encounter for closed fracture with routine healing: Secondary | ICD-10-CM | POA: Diagnosis not present

## 2018-01-17 DIAGNOSIS — Z9181 History of falling: Secondary | ICD-10-CM | POA: Diagnosis not present

## 2018-01-17 DIAGNOSIS — R2689 Other abnormalities of gait and mobility: Secondary | ICD-10-CM | POA: Diagnosis not present

## 2018-01-17 DIAGNOSIS — S72141D Displaced intertrochanteric fracture of right femur, subsequent encounter for closed fracture with routine healing: Secondary | ICD-10-CM | POA: Diagnosis not present

## 2018-01-17 DIAGNOSIS — M6281 Muscle weakness (generalized): Secondary | ICD-10-CM | POA: Diagnosis not present

## 2018-01-20 DIAGNOSIS — S72141D Displaced intertrochanteric fracture of right femur, subsequent encounter for closed fracture with routine healing: Secondary | ICD-10-CM | POA: Diagnosis not present

## 2018-01-22 DIAGNOSIS — S72141D Displaced intertrochanteric fracture of right femur, subsequent encounter for closed fracture with routine healing: Secondary | ICD-10-CM | POA: Diagnosis not present

## 2018-01-24 DIAGNOSIS — S72141D Displaced intertrochanteric fracture of right femur, subsequent encounter for closed fracture with routine healing: Secondary | ICD-10-CM | POA: Diagnosis not present

## 2018-01-26 DIAGNOSIS — S72141D Displaced intertrochanteric fracture of right femur, subsequent encounter for closed fracture with routine healing: Secondary | ICD-10-CM | POA: Diagnosis not present

## 2018-01-29 DIAGNOSIS — S72141D Displaced intertrochanteric fracture of right femur, subsequent encounter for closed fracture with routine healing: Secondary | ICD-10-CM | POA: Diagnosis not present

## 2018-01-29 DIAGNOSIS — R2689 Other abnormalities of gait and mobility: Secondary | ICD-10-CM | POA: Diagnosis not present

## 2018-01-29 DIAGNOSIS — Z9181 History of falling: Secondary | ICD-10-CM | POA: Diagnosis not present

## 2018-01-29 DIAGNOSIS — M6281 Muscle weakness (generalized): Secondary | ICD-10-CM | POA: Diagnosis not present

## 2018-01-31 DIAGNOSIS — S72141D Displaced intertrochanteric fracture of right femur, subsequent encounter for closed fracture with routine healing: Secondary | ICD-10-CM | POA: Diagnosis not present

## 2018-02-01 DIAGNOSIS — S72141D Displaced intertrochanteric fracture of right femur, subsequent encounter for closed fracture with routine healing: Secondary | ICD-10-CM | POA: Diagnosis not present

## 2018-02-03 DIAGNOSIS — S72141D Displaced intertrochanteric fracture of right femur, subsequent encounter for closed fracture with routine healing: Secondary | ICD-10-CM | POA: Diagnosis not present

## 2018-02-06 DIAGNOSIS — R2689 Other abnormalities of gait and mobility: Secondary | ICD-10-CM | POA: Diagnosis not present

## 2018-02-06 DIAGNOSIS — M6281 Muscle weakness (generalized): Secondary | ICD-10-CM | POA: Diagnosis not present

## 2018-02-06 DIAGNOSIS — Z9181 History of falling: Secondary | ICD-10-CM | POA: Diagnosis not present

## 2018-02-06 DIAGNOSIS — S72141D Displaced intertrochanteric fracture of right femur, subsequent encounter for closed fracture with routine healing: Secondary | ICD-10-CM | POA: Diagnosis not present

## 2018-02-07 ENCOUNTER — Other Ambulatory Visit: Payer: Self-pay | Admitting: Geriatric Medicine

## 2018-02-07 DIAGNOSIS — Z1231 Encounter for screening mammogram for malignant neoplasm of breast: Secondary | ICD-10-CM

## 2018-02-10 DIAGNOSIS — S72141D Displaced intertrochanteric fracture of right femur, subsequent encounter for closed fracture with routine healing: Secondary | ICD-10-CM | POA: Diagnosis not present

## 2018-02-10 DIAGNOSIS — M6281 Muscle weakness (generalized): Secondary | ICD-10-CM | POA: Diagnosis not present

## 2018-02-10 DIAGNOSIS — Z9181 History of falling: Secondary | ICD-10-CM | POA: Diagnosis not present

## 2018-02-10 DIAGNOSIS — R2689 Other abnormalities of gait and mobility: Secondary | ICD-10-CM | POA: Diagnosis not present

## 2018-02-28 DIAGNOSIS — S72141D Displaced intertrochanteric fracture of right femur, subsequent encounter for closed fracture with routine healing: Secondary | ICD-10-CM | POA: Diagnosis not present

## 2018-02-28 DIAGNOSIS — R2689 Other abnormalities of gait and mobility: Secondary | ICD-10-CM | POA: Diagnosis not present

## 2018-02-28 DIAGNOSIS — Z9181 History of falling: Secondary | ICD-10-CM | POA: Diagnosis not present

## 2018-02-28 DIAGNOSIS — M6281 Muscle weakness (generalized): Secondary | ICD-10-CM | POA: Diagnosis not present

## 2018-03-31 ENCOUNTER — Ambulatory Visit
Admission: RE | Admit: 2018-03-31 | Discharge: 2018-03-31 | Disposition: A | Discharge status: Home / Self Care | Payer: Medicare Other | Source: Ambulatory Visit | Attending: Geriatric Medicine | Admitting: Geriatric Medicine

## 2018-03-31 DIAGNOSIS — Z1231 Encounter for screening mammogram for malignant neoplasm of breast: Secondary | ICD-10-CM

## 2018-08-30 DIAGNOSIS — Z0000 Encounter for general adult medical examination without abnormal findings: Secondary | ICD-10-CM | POA: Diagnosis not present

## 2018-08-30 DIAGNOSIS — F325 Major depressive disorder, single episode, in full remission: Secondary | ICD-10-CM | POA: Diagnosis not present

## 2018-08-30 DIAGNOSIS — E7800 Pure hypercholesterolemia, unspecified: Secondary | ICD-10-CM | POA: Diagnosis not present

## 2018-08-30 DIAGNOSIS — I10 Essential (primary) hypertension: Secondary | ICD-10-CM | POA: Diagnosis not present

## 2018-09-18 DIAGNOSIS — H6123 Impacted cerumen, bilateral: Secondary | ICD-10-CM | POA: Diagnosis not present

## 2018-11-13 DIAGNOSIS — E119 Type 2 diabetes mellitus without complications: Secondary | ICD-10-CM | POA: Diagnosis not present

## 2018-11-13 DIAGNOSIS — H2513 Age-related nuclear cataract, bilateral: Secondary | ICD-10-CM | POA: Diagnosis not present

## 2019-01-18 DIAGNOSIS — M545 Low back pain: Secondary | ICD-10-CM | POA: Diagnosis not present

## 2019-01-18 DIAGNOSIS — I10 Essential (primary) hypertension: Secondary | ICD-10-CM | POA: Diagnosis not present

## 2019-01-18 DIAGNOSIS — G8929 Other chronic pain: Secondary | ICD-10-CM | POA: Diagnosis not present

## 2019-01-18 DIAGNOSIS — E1169 Type 2 diabetes mellitus with other specified complication: Secondary | ICD-10-CM | POA: Diagnosis not present

## 2019-02-23 ENCOUNTER — Other Ambulatory Visit: Payer: Self-pay | Admitting: Geriatric Medicine

## 2019-02-23 DIAGNOSIS — Z1231 Encounter for screening mammogram for malignant neoplasm of breast: Secondary | ICD-10-CM

## 2019-02-28 DIAGNOSIS — E1169 Type 2 diabetes mellitus with other specified complication: Secondary | ICD-10-CM | POA: Diagnosis not present

## 2019-02-28 DIAGNOSIS — I10 Essential (primary) hypertension: Secondary | ICD-10-CM | POA: Diagnosis not present

## 2019-02-28 DIAGNOSIS — Z7984 Long term (current) use of oral hypoglycemic drugs: Secondary | ICD-10-CM | POA: Diagnosis not present

## 2019-02-28 DIAGNOSIS — E7800 Pure hypercholesterolemia, unspecified: Secondary | ICD-10-CM | POA: Diagnosis not present

## 2019-05-02 ENCOUNTER — Ambulatory Visit
Admission: RE | Admit: 2019-05-02 | Discharge: 2019-05-02 | Disposition: A | Discharge status: Home / Self Care | Payer: Medicare Other | Source: Ambulatory Visit | Attending: Geriatric Medicine | Admitting: Geriatric Medicine

## 2019-05-02 ENCOUNTER — Other Ambulatory Visit: Payer: Self-pay

## 2019-05-02 DIAGNOSIS — Z1231 Encounter for screening mammogram for malignant neoplasm of breast: Secondary | ICD-10-CM

## 2019-09-19 DIAGNOSIS — I10 Essential (primary) hypertension: Secondary | ICD-10-CM | POA: Diagnosis not present

## 2019-09-19 DIAGNOSIS — Z0000 Encounter for general adult medical examination without abnormal findings: Secondary | ICD-10-CM | POA: Diagnosis not present

## 2019-09-19 DIAGNOSIS — E7800 Pure hypercholesterolemia, unspecified: Secondary | ICD-10-CM | POA: Diagnosis not present

## 2019-09-19 DIAGNOSIS — F325 Major depressive disorder, single episode, in full remission: Secondary | ICD-10-CM | POA: Diagnosis not present

## 2019-10-09 DIAGNOSIS — H6122 Impacted cerumen, left ear: Secondary | ICD-10-CM | POA: Diagnosis not present

## 2019-10-28 IMAGING — MG DIGITAL SCREENING BILATERAL MAMMOGRAM WITH TOMO AND CAD
8 series · 9 of 24 positions shown · non-contrast
Comparison: Previous exam(s).

CLINICAL DATA: Screening.

EXAM:
DIGITAL SCREENING BILATERAL MAMMOGRAM WITH TOMO AND CAD

[L MLO synth-2D]
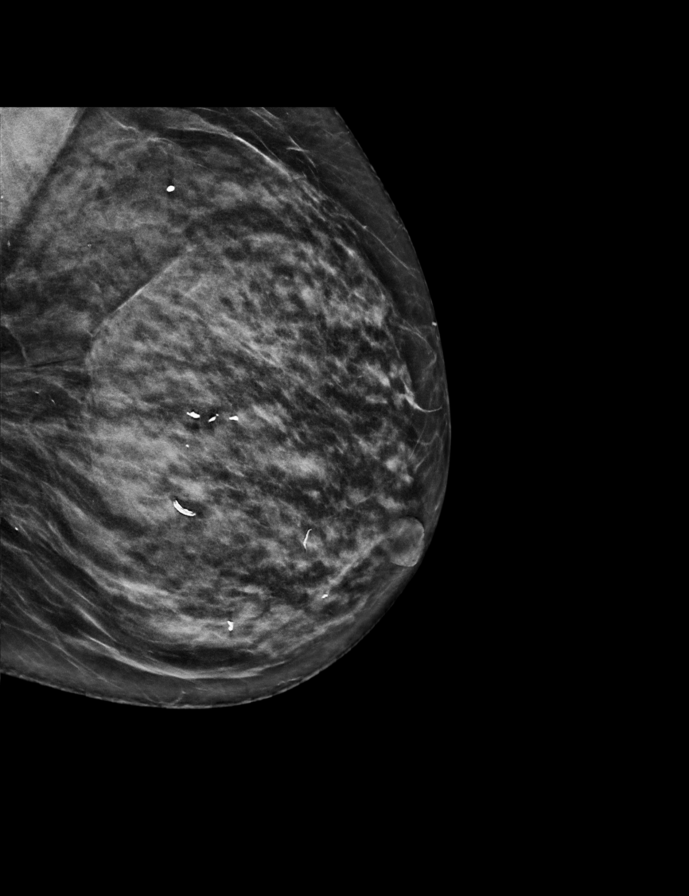

[R CC synth-2D]
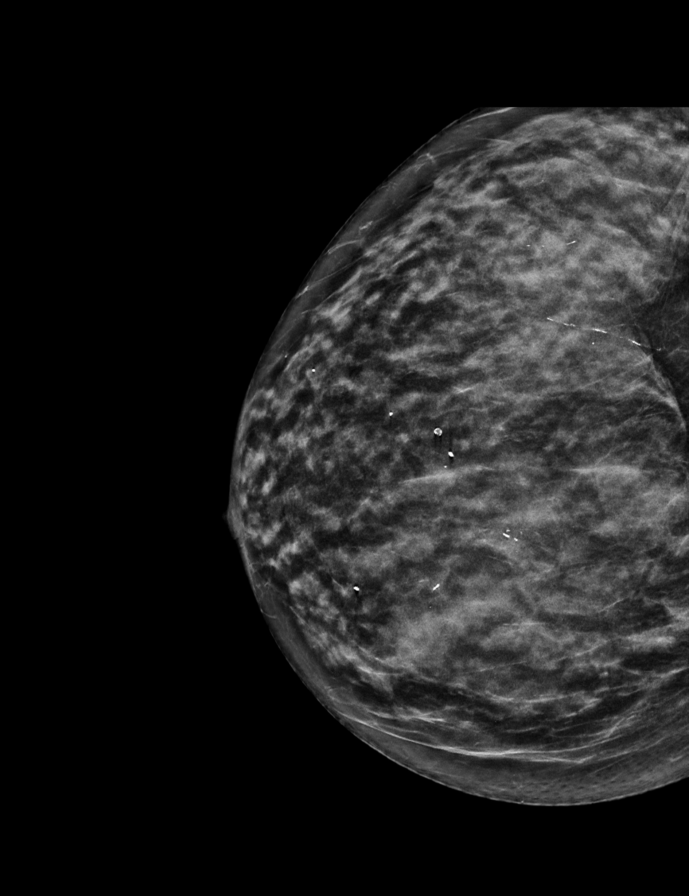

[L CC synth-2D]
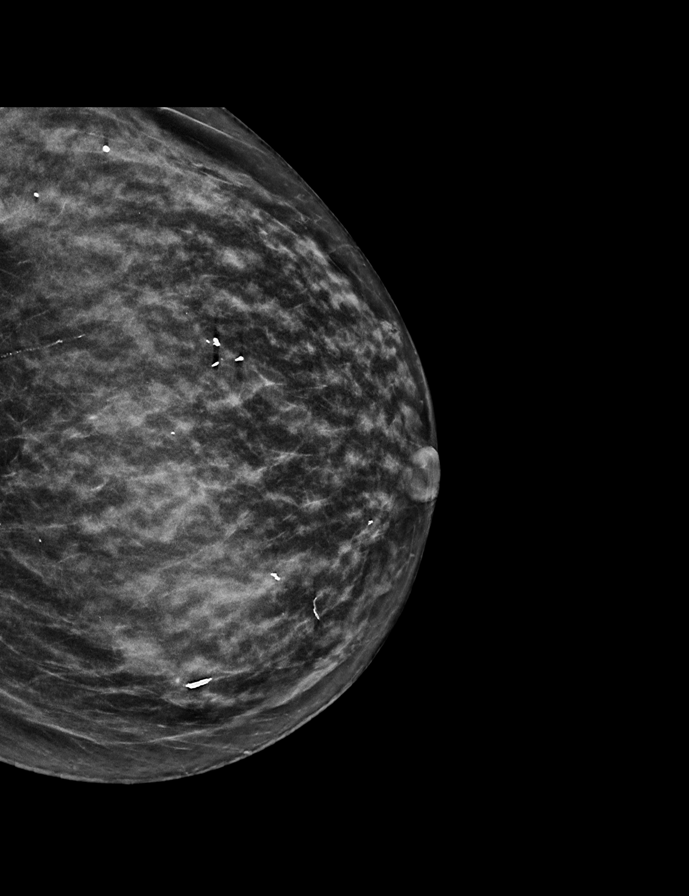

[R MLO synth-2D]
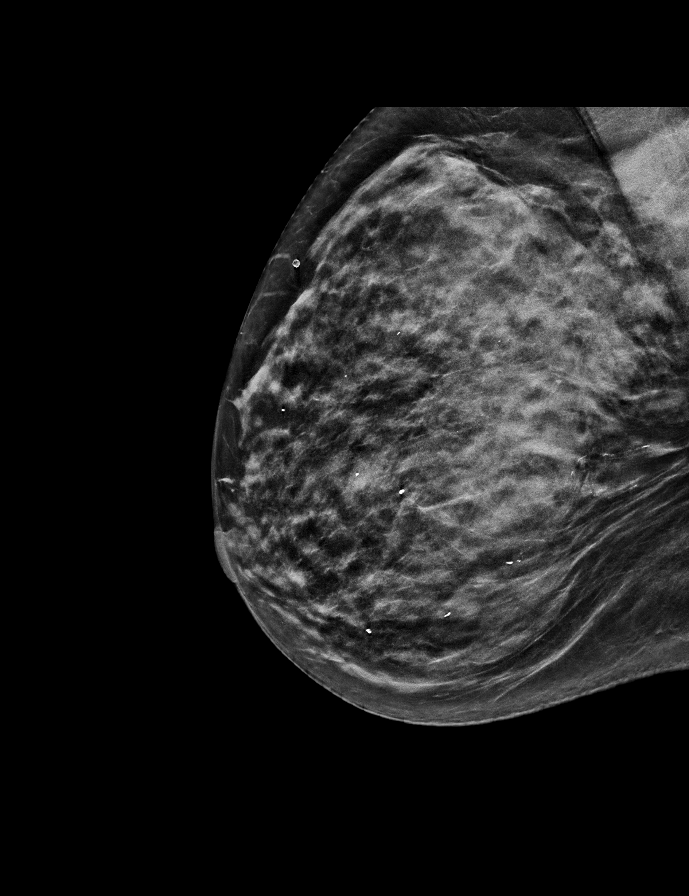

[L MLO tomo · 2 of 55 frames shown]
[frame 18/55]
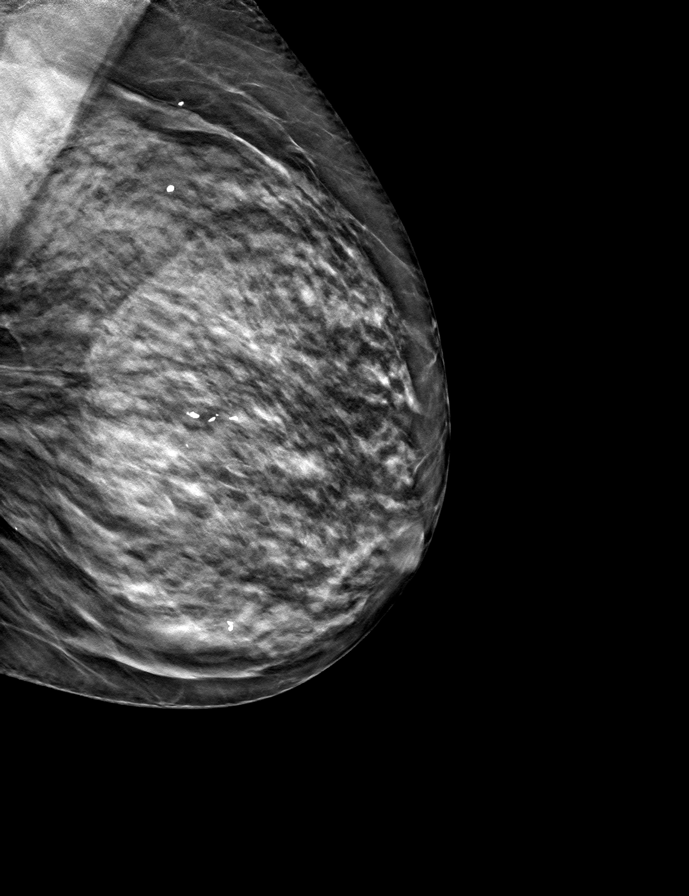
[frame 28/55]
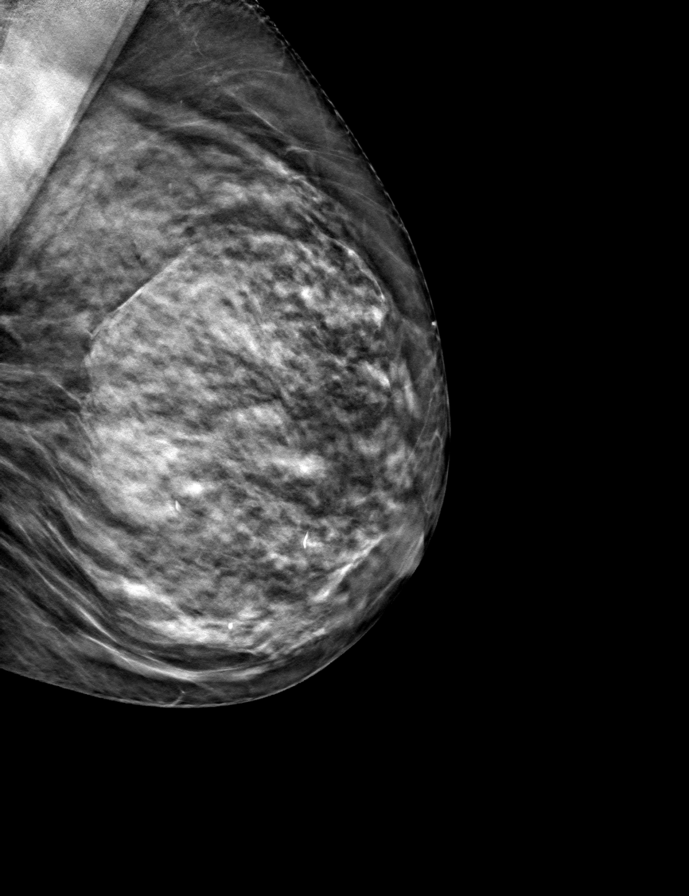

[R MLO tomo · tomo slice 28/55.0]
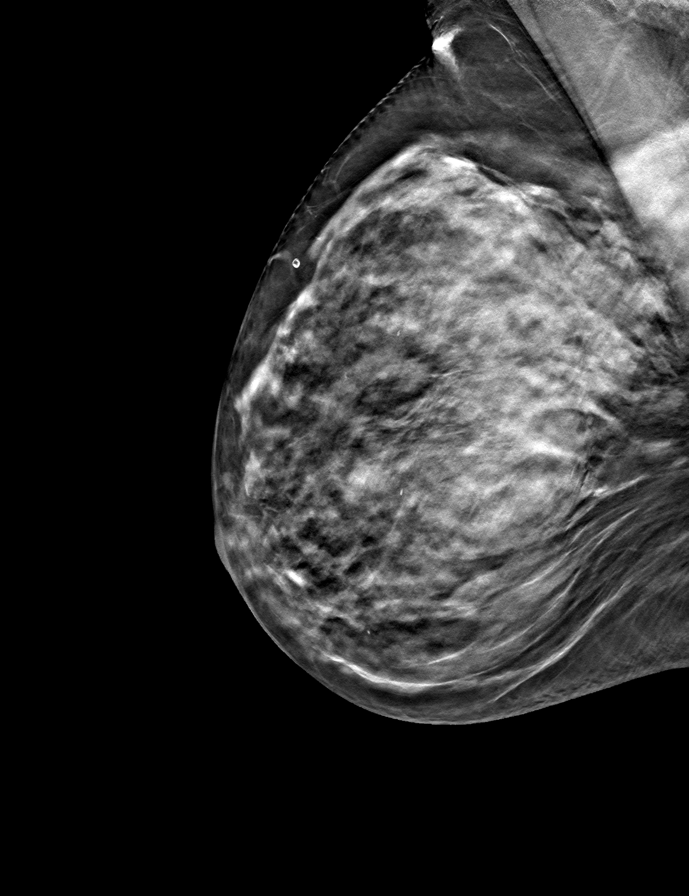

[R CC tomo · tomo slice 25/50.0]
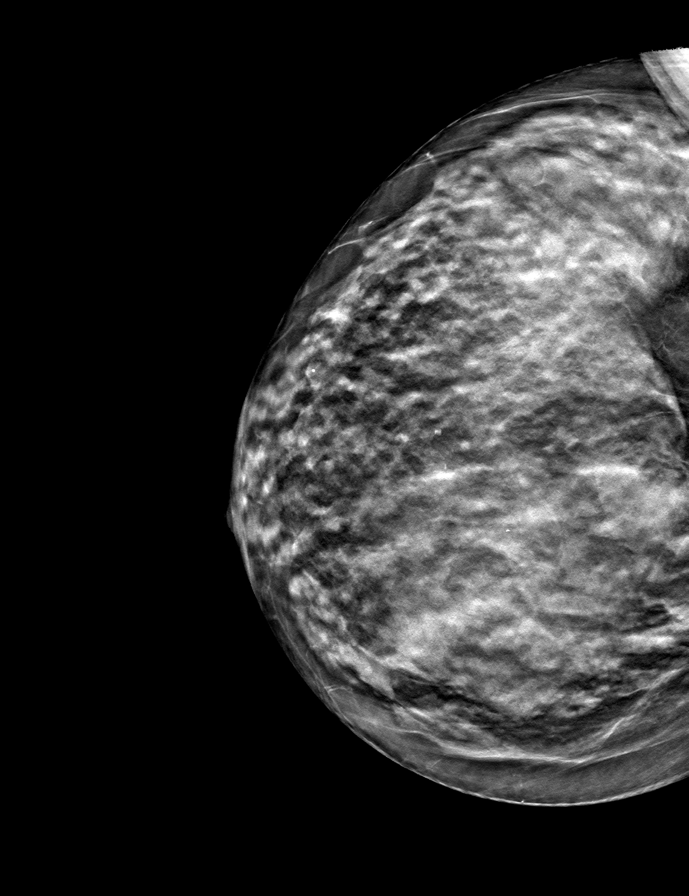

[L CC tomo · tomo slice 25/49.0]
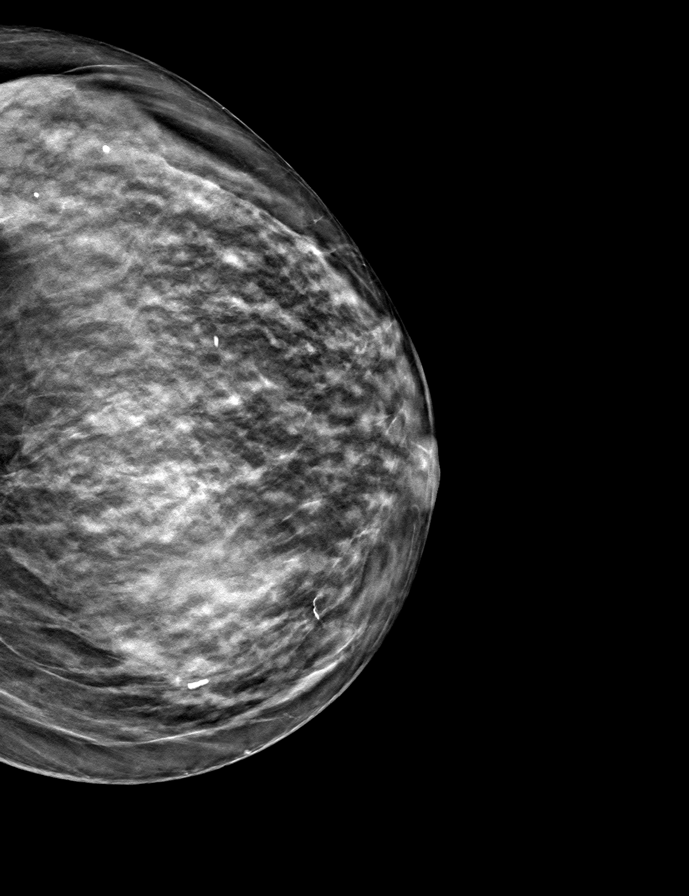

[9 of 24 positions shown; findings below may reference images not displayed]

ACR Breast Density Category d: The breast tissue is extremely dense,
which lowers the sensitivity of mammography.
FINDINGS: There are no findings suspicious for malignancy. Images were
processed with CAD.
IMPRESSION: No mammographic evidence of malignancy. A result letter of this
screening mammogram will be mailed directly to the patient.

RECOMMENDATION:
Screening mammogram in one year. (Code:RA-I-AVB)

BI-RADS CATEGORY  1: Negative.

## 2020-01-05 ENCOUNTER — Encounter (HOSPITAL_COMMUNITY)
Admission: EM | Disposition: A | Discharge status: Skilled Nursing Facility | Payer: Self-pay | Source: Home / Self Care | Attending: Internal Medicine

## 2020-01-05 ENCOUNTER — Inpatient Hospital Stay (HOSPITAL_COMMUNITY): Payer: Medicare Other | Admitting: Certified Registered Nurse Anesthetist

## 2020-01-05 ENCOUNTER — Encounter (HOSPITAL_COMMUNITY): Payer: Self-pay | Admitting: Internal Medicine

## 2020-01-05 ENCOUNTER — Emergency Department (HOSPITAL_COMMUNITY): Payer: Medicare Other

## 2020-01-05 ENCOUNTER — Other Ambulatory Visit: Payer: Self-pay

## 2020-01-05 ENCOUNTER — Inpatient Hospital Stay (HOSPITAL_COMMUNITY): Payer: Medicare Other

## 2020-01-05 ENCOUNTER — Inpatient Hospital Stay (HOSPITAL_COMMUNITY)
Admission: EM | Admit: 2020-01-05 | Discharge: 2020-01-08 | DRG: 482 | Disposition: A | Discharge status: Skilled Nursing Facility | Payer: Medicare Other | Attending: Internal Medicine | Admitting: Internal Medicine

## 2020-01-05 DIAGNOSIS — W19XXXA Unspecified fall, initial encounter: Secondary | ICD-10-CM | POA: Diagnosis not present

## 2020-01-05 DIAGNOSIS — E119 Type 2 diabetes mellitus without complications: Secondary | ICD-10-CM | POA: Diagnosis present

## 2020-01-05 DIAGNOSIS — Z743 Need for continuous supervision: Secondary | ICD-10-CM | POA: Diagnosis not present

## 2020-01-05 DIAGNOSIS — E1165 Type 2 diabetes mellitus with hyperglycemia: Secondary | ICD-10-CM | POA: Diagnosis not present

## 2020-01-05 DIAGNOSIS — R279 Unspecified lack of coordination: Secondary | ICD-10-CM | POA: Diagnosis not present

## 2020-01-05 DIAGNOSIS — Y93K1 Activity, walking an animal: Secondary | ICD-10-CM | POA: Diagnosis not present

## 2020-01-05 DIAGNOSIS — S72042A Displaced fracture of base of neck of left femur, initial encounter for closed fracture: Secondary | ICD-10-CM | POA: Diagnosis not present

## 2020-01-05 DIAGNOSIS — I10 Essential (primary) hypertension: Secondary | ICD-10-CM | POA: Diagnosis present

## 2020-01-05 DIAGNOSIS — S7292XA Unspecified fracture of left femur, initial encounter for closed fracture: Secondary | ICD-10-CM

## 2020-01-05 DIAGNOSIS — S79929A Unspecified injury of unspecified thigh, initial encounter: Secondary | ICD-10-CM | POA: Diagnosis not present

## 2020-01-05 DIAGNOSIS — Z9181 History of falling: Secondary | ICD-10-CM | POA: Diagnosis not present

## 2020-01-05 DIAGNOSIS — W010XXA Fall on same level from slipping, tripping and stumbling without subsequent striking against object, initial encounter: Secondary | ICD-10-CM | POA: Diagnosis present

## 2020-01-05 DIAGNOSIS — Z01818 Encounter for other preprocedural examination: Secondary | ICD-10-CM | POA: Diagnosis not present

## 2020-01-05 DIAGNOSIS — E785 Hyperlipidemia, unspecified: Secondary | ICD-10-CM | POA: Diagnosis present

## 2020-01-05 DIAGNOSIS — Z79899 Other long term (current) drug therapy: Secondary | ICD-10-CM | POA: Diagnosis not present

## 2020-01-05 DIAGNOSIS — Z20822 Contact with and (suspected) exposure to covid-19: Secondary | ICD-10-CM | POA: Diagnosis present

## 2020-01-05 DIAGNOSIS — R52 Pain, unspecified: Secondary | ICD-10-CM | POA: Diagnosis not present

## 2020-01-05 DIAGNOSIS — I959 Hypotension, unspecified: Secondary | ICD-10-CM | POA: Diagnosis not present

## 2020-01-05 DIAGNOSIS — S72002A Fracture of unspecified part of neck of left femur, initial encounter for closed fracture: Secondary | ICD-10-CM | POA: Diagnosis not present

## 2020-01-05 DIAGNOSIS — J439 Emphysema, unspecified: Secondary | ICD-10-CM | POA: Diagnosis not present

## 2020-01-05 DIAGNOSIS — Z4789 Encounter for other orthopedic aftercare: Secondary | ICD-10-CM | POA: Diagnosis not present

## 2020-01-05 DIAGNOSIS — Z7984 Long term (current) use of oral hypoglycemic drugs: Secondary | ICD-10-CM

## 2020-01-05 DIAGNOSIS — S72142D Displaced intertrochanteric fracture of left femur, subsequent encounter for closed fracture with routine healing: Secondary | ICD-10-CM | POA: Diagnosis not present

## 2020-01-05 DIAGNOSIS — S72142A Displaced intertrochanteric fracture of left femur, initial encounter for closed fracture: Secondary | ICD-10-CM | POA: Diagnosis not present

## 2020-01-05 DIAGNOSIS — S7292XD Unspecified fracture of left femur, subsequent encounter for closed fracture with routine healing: Secondary | ICD-10-CM | POA: Diagnosis not present

## 2020-01-05 HISTORY — PX: INTRAMEDULLARY (IM) NAIL INTERTROCHANTERIC: SHX5875

## 2020-01-05 LAB — CBC WITH DIFFERENTIAL/PLATELET
Abs Immature Granulocytes: 0.09 10*3/uL — ABNORMAL HIGH (ref 0.00–0.07)
Basophils Absolute: 0 10*3/uL (ref 0.0–0.1)
Basophils Relative: 0 %
Eosinophils Absolute: 0.2 10*3/uL (ref 0.0–0.5)
Eosinophils Relative: 2 %
HCT: 36.7 % (ref 36.0–46.0)
Hemoglobin: 11.7 g/dL — ABNORMAL LOW (ref 12.0–15.0)
Immature Granulocytes: 1 %
Lymphocytes Relative: 8 %
Lymphs Abs: 0.7 10*3/uL (ref 0.7–4.0)
MCH: 29.9 pg (ref 26.0–34.0)
MCHC: 31.9 g/dL (ref 30.0–36.0)
MCV: 93.9 fL (ref 80.0–100.0)
Monocytes Absolute: 0.6 10*3/uL (ref 0.1–1.0)
Monocytes Relative: 7 %
Neutro Abs: 7.3 10*3/uL (ref 1.7–7.7)
Neutrophils Relative %: 82 %
Platelets: 224 10*3/uL (ref 150–400)
RBC: 3.91 MIL/uL (ref 3.87–5.11)
RDW: 12.7 % (ref 11.5–15.5)
WBC: 9 10*3/uL (ref 4.0–10.5)
nRBC: 0 % (ref 0.0–0.2)

## 2020-01-05 LAB — PROTIME-INR
INR: 1 (ref 0.8–1.2)
Prothrombin Time: 13.1 seconds (ref 11.4–15.2)

## 2020-01-05 LAB — ABO/RH: ABO/RH(D): A POS

## 2020-01-05 LAB — BASIC METABOLIC PANEL
Anion gap: 10 (ref 5–15)
BUN: 31 mg/dL — ABNORMAL HIGH (ref 8–23)
CO2: 24 mmol/L (ref 22–32)
Calcium: 9.3 mg/dL (ref 8.9–10.3)
Chloride: 103 mmol/L (ref 98–111)
Creatinine, Ser: 0.97 mg/dL (ref 0.44–1.00)
GFR calc Af Amer: >60 mL/min (ref >60)
GFR calc non Af Amer: 56 mL/min — ABNORMAL LOW (ref >60)
Glucose, Bld: 220 mg/dL — ABNORMAL HIGH (ref 70–99)
Potassium: 4.3 mmol/L (ref 3.5–5.1)
Sodium: 137 mmol/L (ref 135–145)

## 2020-01-05 LAB — HEMOGLOBIN A1C
Hgb A1c MFr Bld: 9 % — ABNORMAL HIGH (ref 4.8–5.6)
Mean Plasma Glucose: 211.6 mg/dL

## 2020-01-05 LAB — TYPE AND SCREEN
ABO/RH(D): A POS
Antibody Screen: NEGATIVE

## 2020-01-05 LAB — GLUCOSE, CAPILLARY
Glucose-Capillary: 164 mg/dL — ABNORMAL HIGH (ref 70–99)
Glucose-Capillary: 141 mg/dL — ABNORMAL HIGH (ref 70–99)

## 2020-01-05 LAB — RESPIRATORY PANEL BY RT PCR (FLU A&B, COVID)
Influenza A by PCR: NEGATIVE
Influenza B by PCR: NEGATIVE
SARS Coronavirus 2 by RT PCR: NEGATIVE

## 2020-01-05 SURGERY — FIXATION, FRACTURE, INTERTROCHANTERIC, WITH INTRAMEDULLARY ROD
Anesthesia: General | Site: Hip | Laterality: Left

## 2020-01-05 MED ORDER — LABETALOL HCL 100 MG PO TABS
100.0000 mg | ORAL_TABLET | Freq: Two times a day (BID) | ORAL | Status: DC
  Administered 2020-01-06 – 2020-01-08 (×3): 100 mg via ORAL
  Filled 2020-01-05 (×6): qty 1

## 2020-01-05 MED ORDER — SODIUM CHLORIDE 0.9 % IV SOLN: INTRAVENOUS | Status: DC

## 2020-01-05 MED ORDER — HEPARIN SODIUM (PORCINE) 5000 UNIT/ML IJ SOLN: 5000.0000 [IU] | Freq: Three times a day (TID) | INTRAMUSCULAR | Status: DC

## 2020-01-05 MED ORDER — MORPHINE SULFATE (PF) 2 MG/ML IV SOLN: 2.0000 mg | INTRAVENOUS | Status: DC | PRN

## 2020-01-05 MED ORDER — ENSURE PRE-SURGERY PO LIQD: 296.0000 mL | Freq: Once | ORAL | Status: DC

## 2020-01-05 MED ORDER — CALCIUM CARBONATE-VITAMIN D 500-200 MG-UNIT PO TABS
1.0000 | ORAL_TABLET | Freq: Every day | ORAL | Status: DC
  Administered 2020-01-06 – 2020-01-08 (×3): 1 via ORAL
  Filled 2020-01-05 (×4): qty 1

## 2020-01-05 MED ORDER — LIDOCAINE 2% (20 MG/ML) 5 ML SYRINGE
INTRAMUSCULAR | Status: DC | PRN
  Administered 2020-01-05: 40 mg via INTRAVENOUS

## 2020-01-05 MED ORDER — CEFAZOLIN SODIUM-DEXTROSE 2-4 GM/100ML-% IV SOLN
2.0000 g | INTRAVENOUS | Status: AC
  Administered 2020-01-05: 2 g via INTRAVENOUS

## 2020-01-05 MED ORDER — ROCURONIUM BROMIDE 10 MG/ML (PF) SYRINGE
PREFILLED_SYRINGE | INTRAVENOUS | Status: DC | PRN
  Administered 2020-01-05: 50 mg via INTRAVENOUS

## 2020-01-05 MED ORDER — FERROUS SULFATE 325 (65 FE) MG PO TABS
325.0000 mg | ORAL_TABLET | ORAL | Status: DC
  Administered 2020-01-07: 325 mg via ORAL
  Filled 2020-01-05 (×2): qty 1

## 2020-01-05 MED ORDER — PHENYLEPHRINE HCL (PRESSORS) 10 MG/ML IV SOLN
INTRAVENOUS | Status: DC | PRN
  Administered 2020-01-05: 120 ug via INTRAVENOUS
  Administered 2020-01-05 (×2): 80 ug via INTRAVENOUS

## 2020-01-05 MED ORDER — FENTANYL CITRATE (PF) 250 MCG/5ML IJ SOLN
INTRAMUSCULAR | Status: DC | PRN
  Administered 2020-01-05 (×3): 50 ug via INTRAVENOUS

## 2020-01-05 MED ORDER — SIMVASTATIN 20 MG PO TABS
10.0000 mg | ORAL_TABLET | Freq: Every day | ORAL | Status: DC
  Administered 2020-01-06 – 2020-01-08 (×3): 10 mg via ORAL
  Filled 2020-01-05 (×3): qty 1

## 2020-01-05 MED ORDER — DOCUSATE SODIUM 100 MG PO CAPS
100.0000 mg | ORAL_CAPSULE | Freq: Two times a day (BID) | ORAL | Status: DC
  Administered 2020-01-06 – 2020-01-08 (×4): 100 mg via ORAL
  Filled 2020-01-05 (×5): qty 1

## 2020-01-05 MED ORDER — CEFAZOLIN SODIUM-DEXTROSE 2-4 GM/100ML-% IV SOLN
INTRAVENOUS | Status: AC
  Filled 2020-01-05: qty 100

## 2020-01-05 MED ORDER — PHENYLEPHRINE HCL-NACL 10-0.9 MG/250ML-% IV SOLN
INTRAVENOUS | Status: DC | PRN
  Administered 2020-01-05: 25 ug/min via INTRAVENOUS

## 2020-01-05 MED ORDER — SUGAMMADEX SODIUM 200 MG/2ML IV SOLN
INTRAVENOUS | Status: DC | PRN
  Administered 2020-01-05: 200 mg via INTRAVENOUS

## 2020-01-05 MED ORDER — PROPOFOL 10 MG/ML IV BOLUS
INTRAVENOUS | Status: DC | PRN
  Administered 2020-01-05: 90 mg via INTRAVENOUS

## 2020-01-05 MED ORDER — ENALAPRIL MALEATE 5 MG PO TABS
5.0000 mg | ORAL_TABLET | Freq: Every day | ORAL | Status: DC
  Administered 2020-01-06 – 2020-01-08 (×3): 5 mg via ORAL
  Filled 2020-01-05 (×3): qty 1

## 2020-01-05 MED ORDER — 0.9 % SODIUM CHLORIDE (POUR BTL) OPTIME
TOPICAL | Status: DC | PRN
  Administered 2020-01-05: 15:00:00 1000 mL

## 2020-01-05 MED ORDER — ONDANSETRON HCL 4 MG/2ML IJ SOLN
INTRAMUSCULAR | Status: AC
  Filled 2020-01-05: qty 2

## 2020-01-05 MED ORDER — FENTANYL CITRATE (PF) 100 MCG/2ML IJ SOLN: 25.0000 ug | INTRAMUSCULAR | Status: DC | PRN

## 2020-01-05 MED ORDER — DEXAMETHASONE SODIUM PHOSPHATE 10 MG/ML IJ SOLN
INTRAMUSCULAR | Status: AC
  Filled 2020-01-05: qty 1

## 2020-01-05 MED ORDER — ONDANSETRON HCL 4 MG/2ML IJ SOLN
INTRAMUSCULAR | Status: DC | PRN
  Administered 2020-01-05: 4 mg via INTRAVENOUS

## 2020-01-05 MED ORDER — ENOXAPARIN SODIUM 30 MG/0.3ML ~~LOC~~ SOLN
30.0000 mg | SUBCUTANEOUS | Status: DC
  Administered 2020-01-06 – 2020-01-08 (×3): 30 mg via SUBCUTANEOUS
  Filled 2020-01-05 (×3): qty 0.3

## 2020-01-05 MED ORDER — ONDANSETRON HCL 4 MG PO TABS: 4.0000 mg | ORAL_TABLET | Freq: Four times a day (QID) | ORAL | Status: DC | PRN

## 2020-01-05 MED ORDER — INSULIN ASPART 100 UNIT/ML ~~LOC~~ SOLN
0.0000 [IU] | Freq: Three times a day (TID) | SUBCUTANEOUS | Status: DC
  Administered 2020-01-06: 1 [IU] via SUBCUTANEOUS
  Administered 2020-01-06 – 2020-01-07 (×3): 2 [IU] via SUBCUTANEOUS
  Administered 2020-01-08: 1 [IU] via SUBCUTANEOUS

## 2020-01-05 MED ORDER — LACTATED RINGERS IV SOLN: INTRAVENOUS | Status: DC

## 2020-01-05 MED ORDER — HYDROCODONE-ACETAMINOPHEN 5-325 MG PO TABS
1.0000 | ORAL_TABLET | Freq: Four times a day (QID) | ORAL | Status: DC | PRN
  Administered 2020-01-06 – 2020-01-07 (×3): 1 via ORAL
  Filled 2020-01-05 (×3): qty 1

## 2020-01-05 MED ORDER — CEFAZOLIN SODIUM-DEXTROSE 1-4 GM/50ML-% IV SOLN
1.0000 g | Freq: Four times a day (QID) | INTRAVENOUS | Status: AC
  Administered 2020-01-05 – 2020-01-06 (×3): 1 g via INTRAVENOUS
  Filled 2020-01-05 (×3): qty 50

## 2020-01-05 MED ORDER — ACETAMINOPHEN 10 MG/ML IV SOLN
INTRAVENOUS | Status: DC | PRN
  Administered 2020-01-05: 1000 mg via INTRAVENOUS

## 2020-01-05 MED ORDER — FUROSEMIDE 20 MG PO TABS
20.0000 mg | ORAL_TABLET | Freq: Every day | ORAL | Status: DC
  Administered 2020-01-06 – 2020-01-08 (×3): 20 mg via ORAL
  Filled 2020-01-05 (×3): qty 1

## 2020-01-05 MED ORDER — CHLORHEXIDINE GLUCONATE 4 % EX LIQD: 60.0000 mL | Freq: Once | CUTANEOUS | Status: DC

## 2020-01-05 MED ORDER — METOCLOPRAMIDE HCL 5 MG PO TABS: 5.0000 mg | ORAL_TABLET | Freq: Three times a day (TID) | ORAL | Status: DC | PRN

## 2020-01-05 MED ORDER — DEXAMETHASONE SODIUM PHOSPHATE 10 MG/ML IJ SOLN
INTRAMUSCULAR | Status: DC | PRN
  Administered 2020-01-05: 5 mg via INTRAVENOUS

## 2020-01-05 MED ORDER — ACETAMINOPHEN 10 MG/ML IV SOLN
INTRAVENOUS | Status: AC
  Filled 2020-01-05: qty 100

## 2020-01-05 MED ORDER — POVIDONE-IODINE 10 % EX SWAB: 2.0000 "application " | Freq: Once | CUTANEOUS | Status: DC

## 2020-01-05 MED ORDER — ONDANSETRON HCL 4 MG/2ML IJ SOLN: 4.0000 mg | Freq: Four times a day (QID) | INTRAMUSCULAR | Status: DC | PRN

## 2020-01-05 MED ORDER — METOCLOPRAMIDE HCL 5 MG/ML IJ SOLN: 5.0000 mg | Freq: Three times a day (TID) | INTRAMUSCULAR | Status: DC | PRN

## 2020-01-05 MED ORDER — HYDRALAZINE HCL 10 MG PO TABS
10.0000 mg | ORAL_TABLET | Freq: Four times a day (QID) | ORAL | Status: DC | PRN
  Filled 2020-01-05: qty 1

## 2020-01-05 MED ORDER — FENTANYL CITRATE (PF) 250 MCG/5ML IJ SOLN
INTRAMUSCULAR | Status: AC
  Filled 2020-01-05: qty 5

## 2020-01-05 SURGICAL SUPPLY — 37 items
ALCOHOL 70% 16 OZ (MISCELLANEOUS) ×2 IMPLANT
BIT DRILL FLUTED FEMUR 4.2/3 (BIT) ×1 IMPLANT
BNDG COHESIVE 6X5 TAN STRL LF (GAUZE/BANDAGES/DRESSINGS) ×5 IMPLANT
CANISTER SUCT 3000ML PPV (MISCELLANEOUS) ×2 IMPLANT
COVER PERINEAL POST (MISCELLANEOUS) ×2 IMPLANT
COVER SURGICAL LIGHT HANDLE (MISCELLANEOUS) ×2 IMPLANT
COVER WAND RF STERILE (DRAPES) ×2 IMPLANT
DRAPE HALF SHEET 40X57 (DRAPES) IMPLANT
DRAPE INCISE IOBAN 66X45 STRL (DRAPES) ×2 IMPLANT
DRAPE STERI IOBAN 125X83 (DRAPES) ×2 IMPLANT
DRSG ADAPTIC 3X8 NADH LF (GAUZE/BANDAGES/DRESSINGS) ×2 IMPLANT
DURAPREP 26ML APPLICATOR (WOUND CARE) ×2 IMPLANT
ELECT CAUTERY BLADE 6.4 (BLADE) ×2 IMPLANT
ELECT REM PT RETURN 9FT ADLT (ELECTROSURGICAL) ×2
ELECTRODE REM PT RTRN 9FT ADLT (ELECTROSURGICAL) ×1 IMPLANT
GAUZE SPONGE 4X4 12PLY STRL LF (GAUZE/BANDAGES/DRESSINGS) ×2 IMPLANT
GLOVE BIO SURGEON STRL SZ7.5 (GLOVE) ×2 IMPLANT
GLOVE BIOGEL PI IND STRL 8 (GLOVE) ×1 IMPLANT
GLOVE BIOGEL PI INDICATOR 8 (GLOVE) ×1
GOWN STRL REUS W/ TWL LRG LVL3 (GOWN DISPOSABLE) ×1 IMPLANT
GOWN STRL REUS W/ TWL XL LVL3 (GOWN DISPOSABLE) ×1 IMPLANT
GOWN STRL REUS W/TWL LRG LVL3 (GOWN DISPOSABLE) ×4
GOWN STRL REUS W/TWL XL LVL3 (GOWN DISPOSABLE) ×2
GUIDEWIRE 3.2X400 (WIRE) ×2 IMPLANT
KIT BASIN OR (CUSTOM PROCEDURE TRAY) ×2 IMPLANT
KIT TURNOVER KIT B (KITS) ×2 IMPLANT
NAIL TROCH FIX 10X235 LT 130 (Nail) ×1 IMPLANT
NS IRRIG 1000ML POUR BTL (IV SOLUTION) ×2 IMPLANT
PACK GENERAL/GYN (CUSTOM PROCEDURE TRAY) ×2 IMPLANT
PAD ARMBOARD 7.5X6 YLW CONV (MISCELLANEOUS) ×4 IMPLANT
SCREW LOCKING 5.0X32MM (Screw) ×1 IMPLANT
SCREW TFNA 90MM HIP (Screw) ×1 IMPLANT
STAPLER VISISTAT 35W (STAPLE) ×2 IMPLANT
SUT MON AB 2-0 CT1 36 (SUTURE) ×3 IMPLANT
TOWEL GREEN STERILE (TOWEL DISPOSABLE) ×2 IMPLANT
TOWEL GREEN STERILE FF (TOWEL DISPOSABLE) ×2 IMPLANT
WATER STERILE IRR 1000ML POUR (IV SOLUTION) ×2 IMPLANT

## 2020-01-05 NOTE — Consult Note (Signed)
ORTHOPAEDIC CONSULTATION  REQUESTING PHYSICIAN: Emeline General, MD  PCP:  Merlene Laughter, MD  Chief Complaint: Left hip fracture  HPI: Kim Adams is a 79 y.o. female who complains of left hip pain with weightbearing.  She was in her normal state of health prior to presentation to the emergency department today.  She had a ground-level fall over a rolled up floor rug.  She landed on her left hip.  She had immediate pain.  On screening examination radiography in the emergency department she was noted to have a left hip basicervical fracture.  Orthopedic surgery was consulted for management.  She is actually well-known to me, for previous right hip fracture and intramedullary nail treatment.  She continues to live in her retirement community, she is independent with ADLs.  Past Medical History:  Diagnosis Date  . Anxiety   . Depression   . Diabetes mellitus   . Hypertension    Past Surgical History:  Procedure Laterality Date  . INTRAMEDULLARY (IM) NAIL INTERTROCHANTERIC Right 11/01/2017   Procedure: INTRAMEDULLARY (IM) NAIL INTERTROCHANTRIC;  Surgeon: Kim Kida, MD;  Location: [facility];  Service: Orthopedics;  Laterality: Right;  . TONSILLECTOMY     Social History   Socioeconomic History  . Marital status: Single    Spouse name: Not on file  . Number of children: Not on file  . Years of education: Not on file  . Highest education level: Not on file  Occupational History  . Not on file  Tobacco Use  . Smoking status: Never Smoker  . Smokeless tobacco: Never Used  Substance and Sexual Activity  . Alcohol use: No  . Drug use: No  . Sexual activity: Never  Other Topics Concern  . Not on file  Social History Narrative  . Not on file   Social Determinants of Health   Financial Resource Strain:   . Difficulty of Paying Living Expenses: Not on file  Food Insecurity:   . Worried About Programme researcher, broadcasting/film/video in the Last Year: Not on file  . Ran Out of Food in  the Last Year: Not on file  Transportation Needs:   . Lack of Transportation (Medical): Not on file  . Lack of Transportation (Non-Medical): Not on file  Physical Activity:   . Days of Exercise per Week: Not on file  . Minutes of Exercise per Session: Not on file  Stress:   . Feeling of Stress : Not on file  Social Connections:   . Frequency of Communication with Friends and Family: Not on file  . Frequency of Social Gatherings with Friends and Family: Not on file  . Attends Religious Services: Not on file  . Active Member of Clubs or Organizations: Not on file  . Attends Banker Meetings: Not on file  . Marital Status: Not on file   History reviewed. No pertinent family history. Allergies  Allergen Reactions  . Fosamax [Alendronate Sodium] Other (See Comments)    Esophageal problems   Prior to Admission medications   Medication Sig Start Date End Date Taking? Authorizing Provider  Calcium Carbonate-Vit D-Min (CALCIUM/VITAMIN D/MINERALS) 600-200 MG-UNIT TABS Take 1 tablet by mouth daily.   Yes [provider]  enalapril (VASOTEC) 5 MG tablet Take 5 mg by mouth daily.     Yes [provider]  ferrous sulfate 325 (65 FE) MG tablet Take 325 mg by mouth every Monday, Wednesday, and Friday.   Yes [provider]  furosemide (LASIX)  20 MG tablet Take 20 mg by mouth daily.     Yes [provider]  JARDIANCE 10 MG TABS tablet Take 10 mg by mouth daily. 12/18/19  Yes [provider]  labetalol (NORMODYNE) 100 MG tablet Take 100 mg by mouth 2 (two) times daily.     Yes [provider]  metFORMIN (GLUCOPHAGE) 1000 MG tablet Take 1 tablet by mouth daily. 12/18/19  Yes [provider]  simvastatin (ZOCOR) 10 MG tablet Take 1 tablet (10 mg total) by mouth daily at 6 PM. 11/04/17  Yes Tat, Shanon Brow, MD   DG Pelvis 1-2 Views  Result Date: 01/05/2020 CLINICAL DATA:  Acute LEFT hip pain following fall. Initial encounter. EXAM:  PELVIS - 1-2 VIEW COMPARISON:  11/01/2017 radiograph FINDINGS: An intertrochanteric fracture of the LEFT femur is noted with varus angulation. No dislocation. Remote fracture of the proximal RIGHT femur with internal fixation hardware and remote fracture of the INFERIOR RIGHT pubic ramus again noted. No focal bony lesions are identified. IMPRESSION: Intertrochanteric LEFT femur fracture with varus angulation. Electronically Signed   By: Margarette Canada M.D.   On: 01/05/2020 11:12   DG Chest Port 1 View  Result Date: 01/05/2020 CLINICAL DATA:  79 year old female undergoing preoperative evaluation prior to repair of left proximal femoral fracture. EXAM: PORTABLE CHEST 1 VIEW COMPARISON:  Prior chest x-ray 11/01/2017 FINDINGS: Stable cardiac and mediastinal contours. Atherosclerotic calcifications are present in the transverse aorta. Emphysematous changes noted in the upper lungs. No focal airspace consolidation, pulmonary edema, pleural effusion or pneumothorax. No acute osseous abnormality. IMPRESSION: 1. No acute cardiopulmonary process. 2. Pulmonary emphysema. Electronically Signed   By: Jacqulynn Cadet M.D.   On: 01/05/2020 12:12   DG Femur Min 2 Views Left  Addendum Date: 01/05/2020   ADDENDUM REPORT: 01/05/2020 11:26 ADDENDUM: The fracture may actually be basicervical instead of intertrochanteric. Electronically Signed   By: Margarette Canada M.D.   On: 01/05/2020 11:26   Result Date: 01/05/2020 CLINICAL DATA:  Acute LEFT leg pain following fall. Initial encounter. EXAM: LEFT FEMUR 2 VIEWS COMPARISON:  11/01/2017 FINDINGS: An intertrochanteric fracture of the proximal LEFT femur is noted with varus angulation. No dislocation. No other acute bony abnormalities noted. IMPRESSION: Intertrochanteric LEFT femur fracture with varus angulation. Electronically Signed: By: Margarette Canada M.D. On: 01/05/2020 11:14    Positive ROS: All other systems have been reviewed and were otherwise negative with the exception of  those mentioned in the HPI and as above.  Physical Exam: General: Alert, no acute distress Cardiovascular: No pedal edema Respiratory: No cyanosis, no use of accessory musculature GI: No organomegaly, abdomen is soft and non-tender Skin: No lesions in the area of chief complaint Neurologic: Sensation intact distally Psychiatric: Patient is competent for consent with normal mood and affect Lymphatic: No axillary or cervical lymphadenopathy  MUSCULOSKELETAL:  Left lower extremity:  Slightly shortened and externally rotated.  No open wounds at the hip.  Distally good 2+ pulses and sensation motor intact.  Assessment: Left hip basicervical fracture.  Plan: -I discussed with Ms. Cavins at length that this is an operatively managed injury that she has sustained.  She will need the same treatment as she had on the right side, which is an intramedullary nail.  We discussed the risk and benefits of this procedure including but not limited to bleeding, infection, damage to surrounding neurovascular structures, development of DVT, malunion, nonunion, hardware failure, periprosthetic fracture, and the risk of anesthesia.  She has provided informed consent.  -  She will be weightbearing as tolerated postoperatively.  She will be on 81 mg aspirin twice daily for 6 weeks postoperative for DVT prophylaxis.  She will return to the medicine service postop.    Kim Kida, MD Cell [phone]    01/05/2020 3:07 PM

## 2020-01-05 NOTE — Anesthesia Postprocedure Evaluation (Signed)
Anesthesia Post Note  Patient: Kim Adams  Procedure(s) Performed: INTRAMEDULLARY (IM) NAIL INTERTROCHANTRIC (Left Hip)     Patient location during evaluation: PACU Anesthesia Type: General Level of consciousness: awake and alert Pain management: pain level controlled Vital Signs Assessment: post-procedure vital signs reviewed and stable Respiratory status: spontaneous breathing, nonlabored ventilation, respiratory function stable and patient connected to nasal cannula oxygen Cardiovascular status: blood pressure returned to baseline and stable Postop Assessment: no apparent nausea or vomiting Anesthetic complications: no    Last Vitals:  Vitals:   01/05/20 1700 01/05/20 1718  BP: 130/63 102/87  Pulse: 74 85  Resp: (!) 29 17  Temp: 36.7 C (!) 36.2 C  SpO2: 98% 98%    Last Pain:  Vitals:   01/05/20 1718  TempSrc: Oral  PainSc: 0-No pain                 Shelton Silvas

## 2020-01-05 NOTE — Anesthesia Procedure Notes (Signed)
Procedure Name: Intubation Date/Time: 01/05/2020 3:29 PM Performed by: Clearnce Sorrel, CRNA Pre-anesthesia Checklist: Patient identified, Emergency Drugs available, Suction available, Patient being monitored and Timeout performed Patient Re-evaluated:Patient Re-evaluated prior to induction Oxygen Delivery Method: Circle system utilized Preoxygenation: Pre-oxygenation with 100% oxygen Induction Type: IV induction Ventilation: Mask ventilation without difficulty Laryngoscope Size: Mac and 3 Grade View: Grade III Tube type: Oral Tube size: 7.0 mm Number of attempts: 1 Airway Equipment and Method: Stylet Placement Confirmation: positive ETCO2 and breath sounds checked- equal and bilateral Secured at: 23 cm Tube secured with: Tape Dental Injury: Teeth and Oropharynx as per pre-operative assessment

## 2020-01-05 NOTE — ED Provider Notes (Addendum)
[place] EMERGENCY DEPARTMENT Provider Note   CSN: [id number] Arrival date & time: 01/05/20  1010     History Chief Complaint  Patient presents with  . Takilma is a 79 y.o. female.  HPI Patient reports that she got her foot caught on a small throw rug.  It caused her left leg to twist and then for her to fall onto it.  She reports she has pain in her thigh if she moves her leg.  She denies other injury.  Denies she has back pain.  Patient denies she struck her head or chest.  No chest pain no headache.  No neck pain.  She reports she otherwise had been feeling well and was up and active doing her usual activities.  Patient is transported by EMS.  They report good pedal pulse on arrival and throughout transport.  Patient did not have severe pain and less leg was moved.    Past Medical History:  Diagnosis Date  . Anxiety   . Depression   . Diabetes mellitus   . Hypertension     Patient Active Problem List   Diagnosis Date Noted  . Controlled type 2 diabetes mellitus ([place]) 11/04/2017  . Essential hypertension 11/04/2017  . Closed fracture of unspecified part of neck of femur   . Hip fracture ([place]) 11/01/2017  . Prediabetes 11/01/2017  . Fall at nursing home 11/01/2017    Past Surgical History:  Procedure Laterality Date  . INTRAMEDULLARY (IM) NAIL INTERTROCHANTERIC Right 11/01/2017   Procedure: INTRAMEDULLARY (IM) NAIL INTERTROCHANTRIC;  Surgeon: Nicholes Stairs, MD;  Location: [facility];  Service: Orthopedics;  Laterality: Right;  . TONSILLECTOMY       OB History   No obstetric history on file.     No family history on file.  Social History   Tobacco Use  . Smoking status: Never Smoker  . Smokeless tobacco: Never Used  Substance Use Topics  . Alcohol use: No  . Drug use: No    Home Medications Prior to Admission medications   Medication Sig Start Date End Date Taking? Authorizing Provider  Calcium Carbonate-Vit D-Min  (CALCIUM/VITAMIN D/MINERALS) 600-200 MG-UNIT TABS Take 1 tablet by mouth daily.    [provider]  enalapril (VASOTEC) 5 MG tablet Take 5 mg by mouth daily.      [provider]  ferrous sulfate 325 (65 FE) MG tablet Take 325 mg by mouth every Monday, Wednesday, and Friday.    [provider]  furosemide (LASIX) 20 MG tablet Take 20 mg by mouth daily.      [provider]  labetalol (NORMODYNE) 100 MG tablet Take 100 mg by mouth 2 (two) times daily.      [provider]  metFORMIN (GLUCOPHAGE) 850 MG tablet Take 850 mg by mouth daily with breakfast.     [provider]  simvastatin (ZOCOR) 10 MG tablet Take 10 mg by mouth daily.    [provider]  simvastatin (ZOCOR) 10 MG tablet Take 1 tablet (10 mg total) by mouth daily at 6 PM. 11/04/17   Tat, Shanon Brow, MD  sitaGLIPtin (JANUVIA) 100 MG tablet Take 100 mg by mouth daily.    [provider]    Allergies    Fosamax [alendronate sodium]  Review of Systems   Review of Systems 10 Systems reviewed and are negative for acute change except as noted in the HPI.  Physical Exam Updated Vital Signs BP Marland Kitchen)  153/86   Pulse 71   Temp 98.3 F (36.8 C) (Oral)   Resp 16   Ht 5\' 7"  (1.702 m)   Wt 56.2 kg   SpO2 100%   BMI 19.42 kg/m   Physical Exam Constitutional:      Comments: Patient is alert with clear mental status.  No respiratory distress.  HENT:     Head: Normocephalic and atraumatic.  Eyes:     Extraocular Movements: Extraocular movements intact.  Neck:     Comments: No C-spine tenderness to palpation. Cardiovascular:     Rate and Rhythm: Normal rate and regular rhythm.  Pulmonary:     Effort: Pulmonary effort is normal.     Breath sounds: Normal breath sounds.  Abdominal:     General: There is no distension.     Palpations: Abdomen is soft.     Tenderness: There is no abdominal tenderness. There is no guarding.  Musculoskeletal:     Comments: Patient  appears to have some external rotation of the left lower extremity.  Dorsalis pedis pulses 2+ and strong.  Upper extremities good range of motion.  Patient can bring both arms up over her head to take her shirt off without difficulty or pain.  Skin:    General: Skin is warm and dry.  Neurological:     General: No focal deficit present.     Mental Status: She is oriented to person, place, and time.     Coordination: Coordination normal.  Psychiatric:        Mood and Affect: Mood normal.     ED Results / Procedures / Treatments   Labs (all labs ordered are listed, but only abnormal results are displayed) Labs Reviewed  RESPIRATORY PANEL BY RT PCR (FLU A&B, COVID)  BASIC METABOLIC PANEL  CBC WITH DIFFERENTIAL/PLATELET  PROTIME-INR  TYPE AND SCREEN    EKG None EKG not done at this time.  Pending. Radiology DG Pelvis 1-2 Views  Result Date: 01/05/2020 CLINICAL DATA:  Acute LEFT hip pain following fall. Initial encounter. EXAM: PELVIS - 1-2 VIEW COMPARISON:  11/01/2017 radiograph FINDINGS: An intertrochanteric fracture of the LEFT femur is noted with varus angulation. No dislocation. Remote fracture of the proximal RIGHT femur with internal fixation hardware and remote fracture of the INFERIOR RIGHT pubic ramus again noted. No focal bony lesions are identified. IMPRESSION: Intertrochanteric LEFT femur fracture with varus angulation. Electronically Signed   By: 11/03/2017 M.D.   On: 01/05/2020 11:12   DG Femur Min 2 Views Left  Addendum Date: 01/05/2020   ADDENDUM REPORT: 01/05/2020 11:26 ADDENDUM: The fracture may actually be basicervical instead of intertrochanteric. Electronically Signed   By: 01/07/2020 M.D.   On: 01/05/2020 11:26   Result Date: 01/05/2020 CLINICAL DATA:  Acute LEFT leg pain following fall. Initial encounter. EXAM: LEFT FEMUR 2 VIEWS COMPARISON:  11/01/2017 FINDINGS: An intertrochanteric fracture of the proximal LEFT femur is noted with varus angulation. No  dislocation. No other acute bony abnormalities noted. IMPRESSION: Intertrochanteric LEFT femur fracture with varus angulation. Electronically Signed: By: 11/03/2017 M.D. On: 01/05/2020 11:14    Procedures Procedures (including critical care time)  Medications Ordered in ED Medications  morphine 2 MG/ML injection 2 mg (has no administration in time range)    ED Course  I have reviewed the triage vital signs and the nursing notes.  Pertinent labs & imaging results that were available during my care of the patient were reviewed by me and considered in my  medical decision making (see chart for details).  Clinical Course as of Jan 05 1208  Sat Jan 05, 2020  1208 Consult: Dr. Chipper Herb for admission.   [MP]    Clinical Course User Index [MP] Arby Barrette, MD   MDM Rules/Calculators/A&P                      Consult: Reviewed with Dr. Aundria Rud.  Will review images and possible repair later today.  Requests hospitalist admission.  Patient had a mechanical fall.  She otherwise has been well.  At baseline, patient is active and ambulates and goes to exercise class.  No other associated injuries.  Mental status is clear.  Review of systems no signs of Covid.  Will obtain 2-hour testing per request of Dr. Aundria Rud for possible operative management today.  Patient sister has been contacted and updated on hip fracture and plan for operative management.  Final Clinical Impression(s) / ED Diagnoses Final diagnoses:  Closed left hip fracture, initial encounter [facility])    Rx / DC Orders ED Discharge Orders    None       Arby Barrette, MD 01/05/20 1159    Arby Barrette, MD 01/05/20 1209

## 2020-01-05 NOTE — Brief Op Note (Signed)
01/05/2020  4:23 PM  PATIENT:  Marvia Pickles [place]  79 y.o. female  PRE-OPERATIVE DIAGNOSIS:  closed hip fracture left  POST-OPERATIVE DIAGNOSIS:  closed hip fracture left  PROCEDURE:  Procedure(s): INTRAMEDULLARY (IM) NAIL INTERTROCHANTRIC (Left)  SURGEON:  Surgeon(s) and Role:    * Yolonda Kida, MD - Primary  PHYSICIAN ASSISTANT:   ASSISTANTS: none   ANESTHESIA:   general  EBL:  50 mL   BLOOD ADMINISTERED:none  DRAINS: none   LOCAL MEDICATIONS USED:  NONE  SPECIMEN:  No Specimen  DISPOSITION OF SPECIMEN:  N/A  COUNTS:  YES  TOURNIQUET:  * No tourniquets in log *  DICTATION: .Note written in EPIC  PLAN OF CARE: Admit to inpatient   PATIENT DISPOSITION:  PACU - hemodynamically stable.   Delay start of Pharmacological VTE agent (>24hrs) due to surgical blood loss or risk of bleeding: not applicable

## 2020-01-05 NOTE — Op Note (Signed)
Date of Surgery: 01/05/2020  INDICATIONS: Kim Adams is a 79 y.o.-year-old female who sustained a left hip fracture. The risks and benefits of the procedure discussed with the patient and family prior to the procedure and all questions were answered; consent was obtained.  PREOPERATIVE DIAGNOSIS: left hip fracture   POSTOPERATIVE DIAGNOSIS: Same   PROCEDURE: Treatment of intertrochanteric, pertrochanteric, subtrochanteric fracture with intramedullary implant. CPT [phone]   SURGEON: Kathi Der. Aundria Rud, M.D.   ANESTHESIA: general   IV FLUIDS AND URINE: See anesthesia record   ESTIMATED BLOOD LOSS: 50 cc  IMPLANTS:   Synthes TFNA 10 x 235 mm  90 mm proximal compression screw 5.0 x 32 mm distal interlock  DRAINS: None.   COMPLICATIONS: None.   DESCRIPTION OF PROCEDURE: The patient was brought to the operating room and placed supine on the operating table. The patient's leg had been signed prior to the procedure. The patient had the anesthesia placed by the anesthesiologist. The prep verification and incision time-outs were performed to confirm that this was the correct patient, site, side and location. The patient had an SCD on the opposite lower extremity. The patient did receive antibiotics prior to the incision and was re-dosed during the procedure as needed at indicated intervals. The patient was positioned on the fracture table with the table in traction and internal rotation to reduce the hip. The well leg was placed in a scissor position and all bony prominences were well-padded. The patient had the lower extremity prepped and draped in the standard surgical fashion. The incision was made 4 finger breadths superior to the greater trochanter. A guide pin was inserted into the tip of the greater trochanter under fluoroscopic guidance. An opening reamer was used to gain access to the femoral canal. The nail length was measured and inserted down the femoral canal to its proper depth. The  appropriate version of insertion for the lag screw was found under fluoroscopy. A pin was inserted up the femoral neck through the jig. The length of the lag screw was then measured. The lag screw was inserted as near to center-center in the head as possible. The leg was taken out of traction, then the compression screw was used to compress across the fracture. Compression was visualized on serial xrays.   We next turned our attention to the distal interlocking screw.  This was placed through the drill guide of the nail inserter.  A small incision was made overlying the lateral thigh at the screw site, and a tonsil was used to disect down to bone.  A drill pass was made through the jig and across the nail through both cortices.  This was measured, and the appropriate screw was placed under hand power and found to have good bite.    The wound was copiously irrigated with saline and the subcutaneous layer closed with 2.0 vicryl and the skin was reapproximated with staples. The wounds were cleaned and dried a final time and a sterile dressing was placed. The hip was taken through a range of motion at the end of the case under fluoroscopic imaging to visualize the approach-withdraw phenomenon and confirm implant length in the head. The patient was then awakened from anesthesia and taken to the recovery room in stable condition. All counts were correct at the end of the case.   POSTOPERATIVE PLAN: The patient will be weight bearing as tolerated and will return in 2 weeks for staple removal and the patient will receive DVT prophylaxis based on  other medications, activity level, and risk ratio of bleeding to thrombosis.     Kim Rile, MD [facility] [place] [phone] 4:23 PM

## 2020-01-05 NOTE — Progress Notes (Signed)
I discussed the case with the emergency department provider.  Kim Adams is indicated for operative fixation of her left hip fracture.  She will remain nothing by mouth at this time.  We will plan for surgical fixation later today.

## 2020-01-05 NOTE — ED Triage Notes (Signed)
Pt BIB [facility] from [facility]. Per EMS patient had a mechanical fall this morning and tripped on a rug. Pt reports left upper leg pain. No obvious deformity noted upon assessment. Pulse, motor and sensation present in left lower extremity. Pt did have a previous hip fracture as well.

## 2020-01-05 NOTE — Anesthesia Preprocedure Evaluation (Addendum)
Anesthesia Evaluation  Patient identified by MRN, date of birth, ID band Patient awake    Reviewed: Allergy & Precautions, NPO status , Patient's Chart, lab work & pertinent test results  Airway Mallampati: II  TM Distance: >3 FB Neck ROM: Full    Dental  (+) Teeth Intact, Dental Advisory Given   Pulmonary neg pulmonary ROS,    breath sounds clear to auscultation       Cardiovascular hypertension, Pt. on medications and Pt. on home beta blockers  Rhythm:Regular Rate:Normal     Neuro/Psych PSYCHIATRIC DISORDERS Anxiety Depression negative neurological ROS     GI/Hepatic negative GI ROS, Neg liver ROS,   Endo/Other  diabetes, Type 2, Oral Hypoglycemic Agents  Renal/GU negative Renal ROS     Musculoskeletal negative musculoskeletal ROS (+)   Abdominal Normal abdominal exam  (+)   Peds  Hematology negative hematology ROS (+)   Anesthesia Other Findings   Reproductive/Obstetrics                            Anesthesia Physical Anesthesia Plan  ASA: III  Anesthesia Plan: General   Post-op Pain Management:    Induction: Intravenous  PONV Risk Score and Plan: 4 or greater and Ondansetron and Treatment may vary due to age or medical condition  Airway Management Planned: Oral ETT  Additional Equipment: None  Intra-op Plan:   Post-operative Plan: Extubation in OR  Informed Consent: I have reviewed the patients History and Physical, chart, labs and discussed the procedure including the risks, benefits and alternatives for the proposed anesthesia with the patient or authorized representative who has indicated his/her understanding and acceptance.     Dental advisory given  Plan Discussed with: CRNA  Anesthesia Plan Comments:        Anesthesia Quick Evaluation

## 2020-01-05 NOTE — Transfer of Care (Signed)
Immediate Anesthesia Transfer of Care Note  Patient: Kim Adams  Procedure(s) Performed: INTRAMEDULLARY (IM) NAIL INTERTROCHANTRIC (Left Hip)  Patient Location: PACU  Anesthesia Type:General  Level of Consciousness: awake, alert  and oriented  Airway & Oxygen Therapy: Patient Spontanous Breathing  Post-op Assessment: Report given to RN and Post -op Vital signs reviewed and stable  Post vital signs: Reviewed and stable  Last Vitals:  Vitals Value Taken Time  BP 129/71 01/05/20 1646  Temp    Pulse 84 01/05/20 1650  Resp 23 01/05/20 1650  SpO2 97 % 01/05/20 1650  Vitals shown include unvalidated device data.  Last Pain:  Vitals:   01/05/20 1645  TempSrc:   PainSc: (P) 0-No pain         Complications: No apparent anesthesia complications

## 2020-01-05 NOTE — H&P (Signed)
History and Physical    Kim Adams OEU:[id number] DOB: 10/18/1941 DOA: 01/05/2020  PCP: Merlene Laughter, MD  Patient coming from: Home  I have personally briefly reviewed patient's old medical records in [facility]  Chief Complaint: Left hip pain  HPI: Kim Adams is a 79 y.o. female with medical history significant of IIDM, HTN, HLD presented with left pain after a mechanical fall.  This morning while walking her dog, her left foot tripped on a rug and then she lost her balance and fell down on the left hip, severe pain.  Loss of consciousness, no prodrome of relation work but headache.  Denies pain, other joint or muscle.  Had a similar episode last year, causing fracture of the right hip.  Her baseline mobility phase fairly good, she ambulated without any cane or walker.  Not any short of breath or chest pain.. ED Course: Left intertrochanteric versus basal cervical pressure.  Review of Systems: As per HPI otherwise 10 point review of systems negative.    Past Medical History:  Diagnosis Date  . Anxiety   . Depression   . Diabetes mellitus   . Hypertension     Past Surgical History:  Procedure Laterality Date  . INTRAMEDULLARY (IM) NAIL INTERTROCHANTERIC Right 11/01/2017   Procedure: INTRAMEDULLARY (IM) NAIL INTERTROCHANTRIC;  Surgeon: Yolonda Kida, MD;  Location: [facility];  Service: Orthopedics;  Laterality: Right;  . TONSILLECTOMY       reports that she has never smoked. She has never used smokeless tobacco. She reports that she does not drink alcohol or use drugs.  Allergies  Allergen Reactions  . Fosamax [Alendronate Sodium] Other (See Comments)    Esophageal problems    No family history on file.   Prior to Admission medications   Medication Sig Start Date End Date Taking? Authorizing Provider  Calcium Carbonate-Vit D-Min (CALCIUM/VITAMIN D/MINERALS) 600-200 MG-UNIT TABS Take 1 tablet by mouth daily.    [provider]  enalapril  (VASOTEC) 5 MG tablet Take 5 mg by mouth daily.      [provider]  ferrous sulfate 325 (65 FE) MG tablet Take 325 mg by mouth every Monday, Wednesday, and Friday.    [provider]  furosemide (LASIX) 20 MG tablet Take 20 mg by mouth daily.      [provider]  labetalol (NORMODYNE) 100 MG tablet Take 100 mg by mouth 2 (two) times daily.      [provider]  metFORMIN (GLUCOPHAGE) 850 MG tablet Take 850 mg by mouth daily with breakfast.     [provider]  simvastatin (ZOCOR) 10 MG tablet Take 10 mg by mouth daily.    [provider]  simvastatin (ZOCOR) 10 MG tablet Take 1 tablet (10 mg total) by mouth daily at 6 PM. 11/04/17   Tat, Onalee Hua, MD  sitaGLIPtin (JANUVIA) 100 MG tablet Take 100 mg by mouth daily.    [provider]    Physical Exam: Vitals:   01/05/20 1029 01/05/20 1031  BP:  (!) 153/86  Pulse:  71  Resp:  16  Temp:  98.3 F (36.8 C)  TempSrc:  Oral  SpO2:  100%  Weight: 56.2 kg   Height: 5\' 7"  (1.702 m)     Constitutional: NAD, calm, comfortable Vitals:   01/05/20 1029 01/05/20 1031  BP:  (!) 153/86  Pulse:  71  Resp:  16  Temp:  98.3 F (36.8 C)  TempSrc:  Oral  SpO2:  100%  Weight: 56.2 kg   Height: 5\' 7"  (1.702 m)    Eyes: PERRL, lids and conjunctivae normal ENMT: Mucous membranes are moist. Posterior pharynx clear of any exudate or lesions.Normal dentition.  Neck: normal, supple, no masses, no thyromegaly Respiratory: clear to auscultation bilaterally, no wheezing, no crackles. Normal respiratory effort. No accessory muscle use.  Cardiovascular: Regular rate and rhythm, no murmurs / rubs / gallops. No extremity edema. 2+ pedal pulses. No carotid bruits.  Abdomen: no tenderness, no masses palpated. No hepatosplenomegaly. Bowel sounds positive.  Musculoskeletal: Left leg shortened and rotated.  Skin: no rashes, lesions, ulcers. No induration Neurologic: CN 2-12 grossly intact. Sensation  intact, DTR normal. Strength 5/5 in all 4.  Psychiatric: Normal judgment and insight. Alert and oriented x 3. Normal mood.     Labs on Admission: I have personally reviewed following labs and imaging studies  CBC: Recent Labs  Lab 01/05/20 1205  WBC 9.0  NEUTROABS 7.3  HGB 11.7*  HCT 36.7  MCV 93.9  PLT 767   Basic Metabolic Panel: No results for input(s): NA, K, CL, CO2, GLUCOSE, BUN, CREATININE, CALCIUM, MG, PHOS in the last 168 hours. GFR: CrCl cannot be calculated (Patient's most recent lab result is older than the maximum 21 days allowed.). Liver Function Tests: No results for input(s): AST, ALT, ALKPHOS, BILITOT, PROT, ALBUMIN in the last 168 hours. No results for input(s): LIPASE, AMYLASE in the last 168 hours. No results for input(s): AMMONIA in the last 168 hours. Coagulation Profile: Recent Labs  Lab 01/05/20 1205  INR 1.0   Cardiac Enzymes: No results for input(s): CKTOTAL, CKMB, CKMBINDEX, TROPONINI in the last 168 hours. BNP (last 3 results) No results for input(s): PROBNP in the last 8760 hours. HbA1C: No results for input(s): HGBA1C in the last 72 hours. CBG: No results for input(s): GLUCAP in the last 168 hours. Lipid Profile: No results for input(s): CHOL, HDL, LDLCALC, TRIG, CHOLHDL, LDLDIRECT in the last 72 hours. Thyroid Function Tests: No results for input(s): TSH, T4TOTAL, FREET4, T3FREE, THYROIDAB in the last 72 hours. Anemia Panel: No results for input(s): VITAMINB12, FOLATE, FERRITIN, TIBC, IRON, RETICCTPCT in the last 72 hours. Urine analysis:    Component Value Date/Time   COLORURINE YELLOW 11/03/2017 0646   APPEARANCEUR CLEAR 11/03/2017 0646   LABSPEC 1.023 11/03/2017 0646   PHURINE 5.0 11/03/2017 0646   GLUCOSEU 50 (A) 11/03/2017 0646   HGBUR NEGATIVE 11/03/2017 0646   BILIRUBINUR NEGATIVE 11/03/2017 0646   KETONESUR 5 (A) 11/03/2017 0646   PROTEINUR NEGATIVE 11/03/2017 0646   NITRITE NEGATIVE 11/03/2017 0646   LEUKOCYTESUR  NEGATIVE 11/03/2017 0646    Radiological Exams on Admission: DG Pelvis 1-2 Views  Result Date: 01/05/2020 CLINICAL DATA:  Acute LEFT hip pain following fall. Initial encounter. EXAM: PELVIS - 1-2 VIEW COMPARISON:  11/01/2017 radiograph FINDINGS: An intertrochanteric fracture of the LEFT femur is noted with varus angulation. No dislocation. Remote fracture of the proximal RIGHT femur with internal fixation hardware and remote fracture of the INFERIOR RIGHT pubic ramus again noted. No focal bony lesions are identified. IMPRESSION: Intertrochanteric LEFT femur fracture with varus angulation. Electronically Signed   By: Margarette Canada M.D.   On: 01/05/2020 11:12   DG Chest Port 1 View  Result Date: 01/05/2020 CLINICAL DATA:  79 year old female undergoing preoperative evaluation prior to repair of left proximal femoral fracture. EXAM: PORTABLE CHEST 1 VIEW COMPARISON:  Prior chest x-ray 11/01/2017 FINDINGS: Stable cardiac and mediastinal contours. Atherosclerotic calcifications are present in the  transverse aorta. Emphysematous changes noted in the upper lungs. No focal airspace consolidation, pulmonary edema, pleural effusion or pneumothorax. No acute osseous abnormality. IMPRESSION: 1. No acute cardiopulmonary process. 2. Pulmonary emphysema. Electronically Signed   By: Malachy Moan M.D.   On: 01/05/2020 12:12   DG Femur Min 2 Views Left  Addendum Date: 01/05/2020   ADDENDUM REPORT: 01/05/2020 11:26 ADDENDUM: The fracture may actually be basicervical instead of intertrochanteric. Electronically Signed   By: Harmon Pier M.D.   On: 01/05/2020 11:26   Result Date: 01/05/2020 CLINICAL DATA:  Acute LEFT leg pain following fall. Initial encounter. EXAM: LEFT FEMUR 2 VIEWS COMPARISON:  11/01/2017 FINDINGS: An intertrochanteric fracture of the proximal LEFT femur is noted with varus angulation. No dislocation. No other acute bony abnormalities noted. IMPRESSION: Intertrochanteric LEFT femur fracture with  varus angulation. Electronically Signed: By: Harmon Pier M.D. On: 01/05/2020 11:14    EKG: Pending  Assessment/Plan Active Problems:   Femur fracture, left (HCC)   Hip fracture requiring operative repair, left, closed, initial encounter (HCC)  Left intertrochanteric versus basal cervical fracture closed Orthopedic surgeon Dr. Aundria Rud form and plan for ORIF this afternoon No IV fluids and pain meds EKG pending, continue preop beta-blocker  IIDM Hold all her PO DM meds Sliding scale for now  HTN Continue beta blocker and add PRN Hydralazine Hold Lasix and ACEI  HLD On statin.     DVT prophylaxis: Heparin subQ Code Status: Full Code Family Communication: Sister Harriett Sine [facility]) over phone Disposition Plan: Probably Rehab Consults called: Orthopedic Admission status: Medsurg   Emeline General MD [facility] Pager [phone]    01/05/2020, 12:31 PM

## 2020-01-06 LAB — CBC
HCT: 31.3 % — ABNORMAL LOW (ref 36.0–46.0)
Hemoglobin: 10.2 g/dL — ABNORMAL LOW (ref 12.0–15.0)
MCH: 30.9 pg (ref 26.0–34.0)
MCHC: 32.6 g/dL (ref 30.0–36.0)
MCV: 94.8 fL (ref 80.0–100.0)
Platelets: 205 10*3/uL (ref 150–400)
RBC: 3.3 MIL/uL — ABNORMAL LOW (ref 3.87–5.11)
RDW: 12.9 % (ref 11.5–15.5)
WBC: 7.7 10*3/uL (ref 4.0–10.5)
nRBC: 0 % (ref 0.0–0.2)

## 2020-01-06 LAB — GLUCOSE, CAPILLARY
Glucose-Capillary: 194 mg/dL — ABNORMAL HIGH (ref 70–99)
Glucose-Capillary: 149 mg/dL — ABNORMAL HIGH (ref 70–99)
Glucose-Capillary: 177 mg/dL — ABNORMAL HIGH (ref 70–99)

## 2020-01-06 LAB — SARS CORONAVIRUS 2 (TAT 6-24 HRS): SARS Coronavirus 2: NEGATIVE

## 2020-01-06 MED ORDER — POLYETHYLENE GLYCOL 3350 17 G PO PACK
17.0000 g | PACK | Freq: Every day | ORAL | Status: DC
  Administered 2020-01-06 – 2020-01-07 (×2): 17 g via ORAL
  Filled 2020-01-06 (×3): qty 1

## 2020-01-06 MED ORDER — SENNA 8.6 MG PO TABS
1.0000 | ORAL_TABLET | Freq: Every day | ORAL | Status: DC
  Administered 2020-01-06 – 2020-01-08 (×3): 8.6 mg via ORAL
  Filled 2020-01-06 (×3): qty 1

## 2020-01-06 NOTE — Plan of Care (Signed)
  Problem: Education: Goal: Verbalization of understanding the information provided (i.e., activity precautions, restrictions, etc) will improve 01/06/2020 0043 by Elton Sin, RN Outcome: Progressing 01/06/2020 0042 by Elton Sin, RN Outcome: Progressing   Problem: Activity: Goal: Ability to ambulate and perform ADLs will improve Outcome: Progressing   Problem: Pain Management: Goal: Pain level will decrease 01/06/2020 0043 by Elton Sin, RN Outcome: Progressing 01/06/2020 0042 by Elton Sin, RN Outcome: Progressing   Problem: Activity: Goal: Risk for activity intolerance will decrease Outcome: Progressing   Problem: Safety: Goal: Ability to remain free from injury will improve Outcome: Progressing   Problem: Skin Integrity: Goal: Risk for impaired skin integrity will decrease Outcome: Progressing

## 2020-01-06 NOTE — Plan of Care (Signed)
  Problem: Education: Goal: Verbalization of understanding the information provided (i.e., activity precautions, restrictions, etc) will improve Outcome: Progressing   Problem: Activity: Goal: Ability to ambulate and perform ADLs will improve Outcome: Progressing   Problem: Pain Management: Goal: Pain level will decrease Outcome: Progressing   Problem: Activity: Goal: Risk for activity intolerance will decrease Outcome: Progressing   Problem: Coping: Goal: Level of anxiety will decrease Outcome: Progressing   Problem: Pain Managment: Goal: General experience of comfort will improve Outcome: Progressing   Problem: Safety: Goal: Ability to remain free from injury will improve Outcome: Progressing   Problem: Skin Integrity: Goal: Risk for impaired skin integrity will decrease Outcome: Progressing

## 2020-01-06 NOTE — TOC Initial Note (Signed)
Transition of Care [facility]) - Initial/Assessment Note    Patient Details  Name: Kim Adams MRN: [id number] Date of Birth: Aug 28, 1941  Transition of Care [facility]) CM/SW Contact:    Bary Castilla, LCSW Phone Number: [phone] 01/06/2020, 11:37 AM  Clinical Narrative:                 CSW met with patient to discuss PT recommendation of a SNF. Patient was aware of recommendation and in agreement with going to a ST SNF. Patient informed CSW that she was a resident of [facility] and that is where should would like to return. CSW followed up with Claiborne Billings at [facility] however had to leave a message.. CSW received permission to fax referral out to [facility].  TOC team will continue to follow for discharge planning needs.    Expected Discharge Plan: Skilled Nursing Facility Barriers to Discharge: No Barriers Identified   Patient Goals and CMS Choice Patient states their goals for this hospitalization and ongoing recovery are:: To be able to retun to [facility]      Expected Discharge Plan and Services Expected Discharge Plan: [place]                                              Prior Living Arrangements/Services     Patient language and need for interpreter reviewed:: Yes          Care giver support system in place?: Yes (comment)      Activities of Daily Living      Permission Sought/Granted      Share Information with NAME: Kim Adams  Permission granted to share info w AGENCY: [place] granted to share info w Relationship: Sister  Permission granted to share info w Contact Information: [phone]  Emotional Assessment Appearance:: Appears stated age Attitude/Demeanor/Rapport: Engaged, Ambitious Affect (typically observed): Adaptable, Accepting Orientation: : Oriented to Self, Oriented to Place, Oriented to  Time, Oriented to Situation      Admission diagnosis:  Fall [W19.XXXA] Closed left hip fracture, initial encounter  ([place]) [S72.002A] Hip fracture requiring operative repair, left, closed, initial encounter [facility]) [S72.002A] Patient Active Problem List   Diagnosis Date Noted  . Femur fracture, left ([place]) 01/05/2020  . Closed left hip fracture, initial encounter ([place]) 01/05/2020  . Controlled type 2 diabetes mellitus ([place]) 11/04/2017  . Essential hypertension 11/04/2017  . Closed fracture of unspecified part of neck of femur   . Hip fracture ([place]) 11/01/2017  . Prediabetes 11/01/2017  . Fall at nursing home 11/01/2017   PCP:  Lajean Manes, MD Pharmacy:  No Pharmacies Listed    Social Determinants of Health (SDOH) Interventions    Readmission Risk Interventions No flowsheet data found.

## 2020-01-06 NOTE — Evaluation (Signed)
Physical Therapy Evaluation Patient Details Name: Kim Adams MRN: [id number] DOB: 1941/03/27 Today's Date: 01/06/2020   History of Present Illness   Kim Adams is a 79 y.o. female with medical history significant of IIDM, HTN, HLD presented with left pain after a mechanical fall, resutling in L femur fx, now s/p IM Nail, WBAT  Clinical Impression   Patient is s/p above surgery resulting in functional limitations due to the deficits listed below (see PT Problem List). Comes from [facility], where she was independent in ADLs, walking without an assistive device (having rehabbed from her R hip fracture a few years ago); Presents to PT with decr functional mobility, significant pain with LLE weight bearing in stance; Needing min/mod assist with transfers and gait with RW; Motivated to get better and participating well;  Patient will benefit from skilled PT to increase their independence and safety with mobility to allow discharge to the venue listed below.       Follow Up Recommendations SNF;Other (comment)(wants to go to SNF at [facility])    Engineer, agricultural with 5" wheels;3in1 (PT)    Recommendations for Other Services       Precautions / Restrictions Precautions Precautions: Fall Restrictions Weight Bearing Restrictions: Yes LLE Weight Bearing: Weight bearing as tolerated      Mobility  Bed Mobility Overal bed mobility: Needs Assistance Bed Mobility: Supine to Sit     Supine to sit: Mod assist     General bed mobility comments: Cues for technique; Noted very nice half-bridge to EOB on her uninvolved R side; mod assist to elevate trunk to sit, and use of bed pad to square off hips at EOB  Transfers Overall transfer level: Needs assistance Equipment used: Rolling walker (2 wheeled) Transfers: Sit to/from Stand Sit to Stand: Min assist         General transfer comment: Min assist to steady as she moved her hands up to  the RW; Cues for hand placement and safety; Antalgic lean, but overall good rise  Ambulation/Gait Ambulation/Gait assistance: Min guard Gait Distance (Feet): 20 Feet Assistive device: Rolling walker (2 wheeled) Gait Pattern/deviations: Step-to pattern     General Gait Details: Slow steps; Painful with WBing LLE in stance, but good use of RW to off-load  Stairs            Wheelchair Mobility    Modified Rankin (Stroke Patients Only)       Balance Overall balance assessment: Needs assistance   Sitting balance-Leahy Scale: (approaching Fair) Sitting balance - Comments: Antalgic lean, needing multimodal cueing for upright sitting Postural control: Right lateral lean   Standing balance-Leahy Scale: Poor                               Pertinent Vitals/Pain Pain Assessment: Faces Faces Pain Scale: Hurts even more Pain Location: L hip, especially with weight bearing Pain Descriptors / Indicators: Aching;Grimacing Pain Intervention(s): Monitored during session;Repositioned    Home Living Family/patient expects to be discharged to:: Skilled nursing facility                 Additional Comments: Lives at [facility]; Tells me she had good outcomes after going to the Rehab Center at [facility] after her R Hip fracture a few years ago    Prior Function Level of Independence: Independent         Comments: Independent Living at [facility]; manages ADLs independently;  does not drive     Hand Dominance        Extremity/Trunk Assessment   Upper Extremity Assessment Upper Extremity Assessment: Defer to OT evaluation    Lower Extremity Assessment Lower Extremity Assessment: LLE deficits/detail LLE Deficits / Details: Grossly decr AROM and strength L hip, limited by pain post fracture and post-op LLE: Unable to fully assess due to pain       Communication   Communication: HOH  Cognition Arousal/Alertness: Awake/alert Behavior During Therapy: WFL  for tasks assessed/performed Overall Cognitive Status: No family/caregiver present to determine baseline cognitive functioning Area of Impairment: Memory                     Memory: Decreased short-term memory                General Comments      Exercises     Assessment/Plan    PT Assessment Patient needs continued PT services  PT Problem List Decreased strength;Decreased range of motion;Decreased activity tolerance;Decreased balance;Decreased cognition;Decreased mobility;Decreased knowledge of use of DME;Decreased safety awareness;Pain       PT Treatment Interventions DME instruction;Gait training;Functional mobility training;Therapeutic activities;Therapeutic exercise;Balance training;Cognitive remediation;Patient/family education    PT Goals (Current goals can be found in the Care Plan section)  Acute Rehab PT Goals Patient Stated Goal: to get back to [facility]; "therapy, therapy, therapy" PT Goal Formulation: With patient Time For Goal Achievement: 01/20/20 Potential to Achieve Goals: Good    Frequency Min 3X/week   Barriers to discharge        Co-evaluation               AM-PAC PT "6 Clicks" Mobility  Outcome Measure Help needed turning from your back to your side while in a flat bed without using bedrails?: A Lot Help needed moving from lying on your back to sitting on the side of a flat bed without using bedrails?: A Lot Help needed moving to and from a bed to a chair (including a wheelchair)?: A Little Help needed standing up from a chair using your arms (e.g., wheelchair or bedside chair)?: A Little Help needed to walk in hospital room?: A Little Help needed climbing 3-5 steps with a railing? : A Lot 6 Click Score: 15    End of Session Equipment Utilized During Treatment: Gait belt Activity Tolerance: Patient tolerated treatment well Patient left: in chair;with call bell/phone within reach;with chair alarm set Nurse Communication: Mobility  status PT Visit Diagnosis: Unsteadiness on feet (R26.81);Other abnormalities of gait and mobility (R26.89)    Time: 2130-8657 PT Time Calculation (min) (ACUTE ONLY): 20 min   Charges:   PT Evaluation $PT Eval Moderate Complexity: 1 Mod          Roney [place], [place]  Acute Rehabilitation Services Pager [phone] Office [phone]   Colletta [place] 01/06/2020, 9:17 AM

## 2020-01-06 NOTE — Social Work (Signed)
TOC team has been consulted x3 for SNF placement.  Pt from [facility], will need insurance approval for SNF placement. PASRR also pending.   TOC team has sent referral to [facility].  Octavio Graves, MSW, LCSW [facility] Clinical Social Work

## 2020-01-06 NOTE — TOC Progression Note (Signed)
Transition of Care [facility]) - Progression Note    Patient Details  Name: Laquashia Mergenthaler MRN: [id number] Date of Birth: [birth year]  Transition of Care [facility]) CM/SW Contact  Patrice [place], [place] Phone Number: [phone] 01/06/2020, 12:21 PM  Clinical Narrative:     CSW received a return call from Brady at [facility] and she stated that they would have a bed ready for patient on Monday. Will need to start authorization tomorrow.  TOC will team will continue to follow for discharge planning needs.  Expected Discharge Plan: Skilled Nursing Facility Barriers to Discharge: No Barriers Identified  Expected Discharge Plan and Services Expected Discharge Plan: Skilled Nursing Facility                                               Social Determinants of Health (SDOH) Interventions    Readmission Risk Interventions No flowsheet data found.

## 2020-01-06 NOTE — NC FL2 (Addendum)
[place] MEDICAID FL2 LEVEL OF CARE SCREENING TOOL     IDENTIFICATION  Patient Name: Kim Adams Birthdate: 12/31/1940 Sex: female Admission Date (Current Location): 01/05/2020  [employer] and [place] Number:  Producer, television/film/video and Address:  [facility], [address], [place], [place] [address]      Provider Number: [id number]  Attending Physician Name and Address:  Burnadette Pop, MD  Relative Name and Phone Number:       Current Level of Care: Hospital Recommended Level of Care: Skilled Nursing Facility Prior Approval Number:    Date Approved/Denied:   PASRR Number: [id number] E expires 02/06/20  Discharge Plan: SNF    Current Diagnoses: Patient Active Problem List   Diagnosis Date Noted  . Femur fracture, left (HCC) 01/05/2020  . Closed left hip fracture, initial encounter (H[facility]) 01/05/2020  . Controlled type 2 diabetes mellitus (HCC) 11/04/2017  . Essential hypertension 11/04/2017  . Closed fracture of unspecified part of neck of femur   . Hip fracture (HCC) 11/01/2017  . Prediabetes 11/01/2017  . Fall at nursing home 11/01/2017    Orientation RESPIRATION BLADDER Height & Weight     Self, Time, Situation, Place  Normal Continent Weight: 124 lb (56.2 kg) Height:  5\' 7"  (170.2 cm)  BEHAVIORAL SYMPTOMS/MOOD NEUROLOGICAL BOWEL NUTRITION STATUS      Continent Diet(see discharge summary)  AMBULATORY STATUS COMMUNICATION OF NEEDS Skin   Extensive Assist Verbally Surgical wounds, Other (Comment)(dry; surgical incision on L hip with gauze dressing)                       Personal Care Assistance Level of Assistance  Dressing, Feeding, Bathing Bathing Assistance: Maximum assistance Feeding assistance: Independent Dressing Assistance: Maximum assistance     Functional Limitations Info  Sight, Speech, Hearing Sight Info: Adequate Hearing Info: Adequate Speech Info: Adequate    SPECIAL CARE FACTORS FREQUENCY  PT (By licensed  PT), OT (By licensed OT)     PT Frequency: 5x week OT Frequency: 5x week            Contractures Contractures Info: Not present    Additional Factors Info  Code Status, Allergies, Insulin Sliding Scale Code Status Info: Full Code Allergies Info: Fosamax (alendronate sodium)   Insulin Sliding Scale Info: insulin aspart (novoLOG) injection 0-9 Units 3x daily with meals       Current Medications (01/06/2020):  This is the current hospital active medication list Current Facility-Administered Medications  Medication Dose Route Frequency Provider Last Rate Last Admin  . calcium-vitamin D (OSCAL WITH D) 500-200 MG-UNIT per tablet 1 tablet  1 tablet Oral Daily 01/08/2020, MD   1 tablet at 01/06/20 [phone]  . docusate sodium (COLACE) capsule 100 mg  100 mg Oral BID 8127, MD   100 mg at 01/06/20 01/08/20  . enalapril (VASOTEC) tablet 5 mg  5 mg Oral Daily 5170, MD   5 mg at 01/06/20 0836  . enoxaparin (LOVENOX) injection 30 mg  30 mg Subcutaneous Q24H 01/08/20, MD   30 mg at 01/06/20 [phone]  . [START ON 01/07/2020] ferrous sulfate tablet 325 mg  325 mg Oral Q M,W,F 03/08/2020, MD      . furosemide (LASIX) tablet 20 mg  20 mg Oral Daily Yolonda Kida, MD   20 mg at 01/06/20 [phone]  . hydrALAZINE (APRESOLINE) tablet 10 mg  10 mg Oral Q6H PRN 9449  Saralyn Pilar, MD      . HYDROcodone-acetaminophen (NORCO/VICODIN) 5-325 MG per tablet 1-2 tablet  1-2 tablet Oral Q6H PRN Nicholes Stairs, MD      . insulin aspart (novoLOG) injection 0-9 Units  0-9 Units Subcutaneous TID WC Nicholes Stairs, MD      . labetalol (NORMODYNE) tablet 100 mg  100 mg Oral BID Nicholes Stairs, MD   100 mg at 01/06/20 6195  . lactated ringers infusion   Intravenous Continuous Nicholes Stairs, MD 10 mL/hr at 01/05/20 1510 Restarted at 01/05/20 1629  . metoCLOPramide (REGLAN) tablet 5-10 mg  5-10 mg Oral Q8H PRN Nicholes Stairs, MD        Or  . metoCLOPramide [facility]) injection 5-10 mg  5-10 mg Intravenous Q8H PRN Nicholes Stairs, MD      . morphine 2 MG/ML injection 2 mg  2 mg Intravenous Q1H PRN Nicholes Stairs, MD      . ondansetron [facility]) tablet 4 mg  4 mg Oral Q6H PRN Nicholes Stairs, MD       Or  . ondansetron [facility] injection 4 mg  4 mg Intravenous Q6H PRN Nicholes Stairs, MD      . simvastatin (ZOCOR) tablet 10 mg  10 mg Oral Daily Nicholes Stairs, MD   10 mg at 01/06/20 0932     Discharge Medications: Please see discharge summary for a list of discharge medications.  Relevant Imaging Results:  Relevant Lab Results:   Additional Information SS# [place], [place]

## 2020-01-06 NOTE — Progress Notes (Addendum)
PROGRESS NOTE    Kim Adams  YSA:[id number] DOB: 1941-04-29 DOA: 01/05/2020 PCP: Merlene Laughter, MD   Brief Narrative: Patient is a 79 year old female with history of diabetes mellitus, hypertension, hyperlipidemia who presented with left hip pain after a mechanical fall at independent living facility she was walking with her dog, lost her balance and fell.  No loss of consciousness.  X-ray done in the emergency department showed left hip fracture.  Orthopedics consulted and she underwent intramedullary nailing by orthopedics.  PT recommended skilled nursing facility.  She is hemodynamically stable for discharge to skilled nursing facility as soon as bed is available.  Assessment & Plan:   Active Problems:   Femur fracture, left (HCC)   Closed left hip fracture, initial encounter (HCC)  Left intertrochanteric fracture: Mechanical fall.  Orthopedics did intramedullary nailing on 01/05/2020.  Continue pain management.  PT/OT consulted.  She lives in independent living facility at [facility].  PT recommended skilled nursing facility. On baseline.  She ambulates without the help of walker or cane  Type 2 diabetes mellitus: On oral diabetic medication at home.  Continue sliding scale insulin for now.  Monitor CBGs  Hypertension: Currently blood pressure stable.  Continue home medications  Hyperlipidemia: On statin         DVT prophylaxis: Lovenox Code Status: Full code Family Communication: Called sister at phone on 01/06/20 Disposition Plan: Patient is from independent living facility.  PT recommended skilled nursing facility.  She is hemodynamically stable for discharge soon as bed is available.   Consultants: Orthopedics  Procedures: Intramedullary nailing  Antimicrobials:  Anti-infectives (From admission, onward)   Start     Dose/Rate Route Frequency Ordered Stop   01/05/20 2200  ceFAZolin (ANCEF) IVPB 1 g/50 mL premix     1 g 100 mL/hr over 30 Minutes Intravenous Every 6  hours 01/05/20 1724 01/06/20 1559   01/05/20 1500  ceFAZolin (ANCEF) IVPB 2g/100 mL premix     2 g 200 mL/hr over 30 Minutes Intravenous On call to O.R. 01/05/20 1410 01/05/20 1531   01/05/20 1412  ceFAZolin (ANCEF) 2-4 GM/100ML-% IVPB    Note to Pharmacy: Shireen Quan   : cabinet override      01/05/20 1412 01/05/20 1544      Subjective:  Patient seen and examined at the bedside this morning.  Hemodynamically stable.  Comfortable.  Sitting in the chair.  She states she just worked  with the physical therapist.  Pain is well controlled  Objective: Vitals:   01/05/20 2015 01/06/20 0003 01/06/20 0404 01/06/20 0747  BP: 92/60 105/60 (!) 108/53 115/83  Pulse: 94 89 80 97  Resp: 15 18 18 17   Temp: 98.5 F (36.9 C) 98.2 F (36.8 C) 98.7 F (37.1 C) 98.5 F (36.9 C)  TempSrc: Oral Oral Oral Oral  SpO2: 95% 95% 97% 97%  Weight:      Height:        Intake/Output Summary (Last 24 hours) at 01/06/2020 0849 Last data filed at 01/06/2020 0000 Gross per 24 hour  Intake 1093.14 ml  Output 850 ml  Net 243.14 ml   Filed Weights   01/05/20 1029  Weight: 56.2 kg    Examination:  General exam: Pleasant elderly female, comfortable Respiratory system: Bilateral equal air entry, normal vesicular breath sounds, no wheezes or crackles  Cardiovascular system: S1 & S2 heard, RRR. No JVD, murmurs, rubs, gallops or clicks. No pedal edema. Gastrointestinal system: Abdomen is nondistended, soft and nontender. No organomegaly or masses  felt. Normal bowel sounds heard. Central nervous system: Alert and oriented. No focal neurological deficits. Extremities: No edema, no clubbing ,no cyanosis, clean surgical wound on the left hip  skin: No rashes, lesions or ulcers,no icterus ,no pallor     Data Reviewed: I have personally reviewed following labs and imaging studies  CBC: Recent Labs  Lab 01/05/20 1205 01/06/20 0546  WBC 9.0 7.7  NEUTROABS 7.3  --   HGB 11.7* 10.2*  HCT 36.7 31.3*  MCV  93.9 94.8  PLT 224 205   Basic Metabolic Panel: Recent Labs  Lab 01/05/20 1205  NA 137  K 4.3  CL 103  CO2 24  GLUCOSE 220*  BUN 31*  CREATININE 0.97  CALCIUM 9.3   GFR: Estimated Creatinine Clearance: 42.4 mL/min (by C-G formula based on SCr of 0.97 mg/dL). Liver Function Tests: No results for input(s): AST, ALT, ALKPHOS, BILITOT, PROT, ALBUMIN in the last 168 hours. No results for input(s): LIPASE, AMYLASE in the last 168 hours. No results for input(s): AMMONIA in the last 168 hours. Coagulation Profile: Recent Labs  Lab 01/05/20 1205  INR 1.0   Cardiac Enzymes: No results for input(s): CKTOTAL, CKMB, CKMBINDEX, TROPONINI in the last 168 hours. BNP (last 3 results) No results for input(s): PROBNP in the last 8760 hours. HbA1C: Recent Labs    01/05/20 1232  HGBA1C 9.0*   CBG: Recent Labs  Lab 01/05/20 1421 01/05/20 1650  GLUCAP 164* 141*   Lipid Profile: No results for input(s): CHOL, HDL, LDLCALC, TRIG, CHOLHDL, LDLDIRECT in the last 72 hours. Thyroid Function Tests: No results for input(s): TSH, T4TOTAL, FREET4, T3FREE, THYROIDAB in the last 72 hours. Anemia Panel: No results for input(s): VITAMINB12, FOLATE, FERRITIN, TIBC, IRON, RETICCTPCT in the last 72 hours. Sepsis Labs: No results for input(s): PROCALCITON, LATICACIDVEN in the last 168 hours.  Recent Results (from the past 240 hour(s))  Respiratory Panel by RT PCR (Flu A&B, Covid) - Nasopharyngeal Swab     Status: None   Collection Time: 01/05/20 12:49 PM   Specimen: Nasopharyngeal Swab  Result Value Ref Range Status   SARS Coronavirus 2 by RT PCR NEGATIVE NEGATIVE Final    Comment: (NOTE) SARS-CoV-2 target nucleic acids are NOT DETECTED. The SARS-CoV-2 RNA is generally detectable in upper respiratoy specimens during the acute phase of infection. The lowest concentration of SARS-CoV-2 viral copies this assay can detect is 131 copies/mL. A negative result does not preclude SARS-Cov-2 infection  and should not be used as the sole basis for treatment or other patient management decisions. A negative result may occur with  improper specimen collection/handling, submission of specimen other than nasopharyngeal swab, presence of viral mutation(s) within the areas targeted by this assay, and inadequate number of viral copies (<131 copies/mL). A negative result must be combined with clinical observations, patient history, and epidemiological information. The expected result is Negative. Fact Sheet for Patients:  https://www.moore.com/ Fact Sheet for Healthcare Providers:  https://www.young.biz/ This test is not yet ap proved or cleared by the [place] FDA and  has been authorized for detection and/or diagnosis of SARS-CoV-2 by FDA under an Emergency Use Authorization (EUA). This EUA will remain  in effect (meaning this test can be used) for the duration of the COVID-19 declaration under Section 564(b)(1) of the Act, 21 U.S.C. section 360bbb-3(b)(1), unless the authorization is terminated or revoked sooner.    Influenza A by PCR NEGATIVE NEGATIVE Final   Influenza B by PCR NEGATIVE NEGATIVE Final    Comment: (  NOTE) The Xpert Xpress SARS-CoV-2/FLU/RSV assay is intended as an aid in  the diagnosis of influenza from Nasopharyngeal swab specimens and  should not be used as a sole basis for treatment. Nasal washings and  aspirates are unacceptable for Xpert Xpress SARS-CoV-2/FLU/RSV  testing. Fact Sheet for Patients: PinkCheek.be Fact Sheet for Healthcare Providers: GravelBags.it This test is not yet approved or cleared by the [place] FDA and  has been authorized for detection and/or diagnosis of SARS-CoV-2 by  FDA under an Emergency Use Authorization (EUA). This EUA will remain  in effect (meaning this test can be used) for the duration of the  Covid-19 declaration under Section  564(b)(1) of the Act, 21  U.S.C. section 360bbb-3(b)(1), unless the authorization is  terminated or revoked. Performed at [facility], [facility] [address], [place], [place] [address]          Radiology Studies: DG Pelvis 1-2 Views  Result Date: 01/05/2020 CLINICAL DATA:  Acute LEFT hip pain following fall. Initial encounter. EXAM: PELVIS - 1-2 VIEW COMPARISON:  11/01/2017 radiograph FINDINGS: An intertrochanteric fracture of the LEFT femur is noted with varus angulation. No dislocation. Remote fracture of the proximal RIGHT femur with internal fixation hardware and remote fracture of the INFERIOR RIGHT pubic ramus again noted. No focal bony lesions are identified. IMPRESSION: Intertrochanteric LEFT femur fracture with varus angulation. Electronically Signed   By: Margarette Canada M.D.   On: 01/05/2020 11:12   DG Chest Port 1 View  Result Date: 01/05/2020 CLINICAL DATA:  79 year old female undergoing preoperative evaluation prior to repair of left proximal femoral fracture. EXAM: PORTABLE CHEST 1 VIEW COMPARISON:  Prior chest x-ray 11/01/2017 FINDINGS: Stable cardiac and mediastinal contours. Atherosclerotic calcifications are present in the transverse aorta. Emphysematous changes noted in the upper lungs. No focal airspace consolidation, pulmonary edema, pleural effusion or pneumothorax. No acute osseous abnormality. IMPRESSION: 1. No acute cardiopulmonary process. 2. Pulmonary emphysema. Electronically Signed   By: Jacqulynn Cadet M.D.   On: 01/05/2020 12:12   DG C-Arm 1-60 Min  Result Date: 01/05/2020 CLINICAL DATA:  Left hip fracture EXAM: DG C-ARM 1-60 MIN FLUOROSCOPY TIME:  Fluoroscopy Time:  47 seconds Number of Acquired Spot Images: 4 COMPARISON:  01/05/2020 FINDINGS: The patient has undergone intramedullary nail placement through the proximal left femur. The osseous alignment is improved. The hardware is intact. Again noted is an intratrochanteric fracture of the proximal left femur.  Vascular calcifications are noted. IMPRESSION: Status post ORIF with improved osseous alignment. Electronically Signed   By: Constance Holster M.D.   On: 01/05/2020 17:13   DG HIP OPERATIVE UNILAT W OR W/O PELVIS LEFT  Result Date: 01/05/2020 CLINICAL DATA:  Intramedullary nail placement EXAM: OPERATIVE LEFT HIP (WITH PELVIS IF PERFORMED) 4 VIEWS TECHNIQUE: Fluoroscopic spot image(s) were submitted for interpretation post-operatively. COMPARISON:  01/05/2020 FINDINGS: The patient has undergone intramedullary nail placement through the proximal left femur. The osseous alignment is improved. The hardware is intact. Again noted is an intratrochanteric fracture of the proximal left femur. Vascular calcifications are noted. IMPRESSION: Status post ORIF with improved osseous alignment. Electronically Signed   By: Constance Holster M.D.   On: 01/05/2020 17:13   DG Femur Min 2 Views Left  Addendum Date: 01/05/2020   ADDENDUM REPORT: 01/05/2020 11:26 ADDENDUM: The fracture may actually be basicervical instead of intertrochanteric. Electronically Signed   By: Margarette Canada M.D.   On: 01/05/2020 11:26   Result Date: 01/05/2020 CLINICAL DATA:  Acute LEFT leg pain following fall.  Initial encounter. EXAM: LEFT FEMUR 2 VIEWS COMPARISON:  11/01/2017 FINDINGS: An intertrochanteric fracture of the proximal LEFT femur is noted with varus angulation. No dislocation. No other acute bony abnormalities noted. IMPRESSION: Intertrochanteric LEFT femur fracture with varus angulation. Electronically Signed: By: Harmon Pier M.D. On: 01/05/2020 11:14        Scheduled Meds: . calcium-vitamin D  1 tablet Oral Daily  . docusate sodium  100 mg Oral BID  . enalapril  5 mg Oral Daily  . enoxaparin (LOVENOX) injection  30 mg Subcutaneous Q24H  . [START ON 01/07/2020] ferrous sulfate  325 mg Oral Q M,W,F  . furosemide  20 mg Oral Daily  . insulin aspart  0-9 Units Subcutaneous TID WC  . labetalol  100 mg Oral BID  . simvastatin   10 mg Oral Daily   Continuous Infusions: . sodium chloride 75 mL/hr at 01/06/20 0000  .  ceFAZolin (ANCEF) IV 1 g (01/06/20 0835)  . lactated ringers 10 mL/hr at 01/05/20 1510     LOS: 1 day    Time spent:25 mins. More than 50% of that time was spent in counseling and/or coordination of care.      Burnadette Pop, MD [facility] P2/28/2021, 8:49 AM

## 2020-01-06 NOTE — Progress Notes (Signed)
Patient ID: Kim Adams, female   DOB: Aug 28, 1941, 79 y.o.   MRN: [id number] Subjective: 1 Day Post-Op Procedure(s) (LRB): INTRAMEDULLARY (IM) NAIL INTERTROCHANTRIC (Left)    Patient reports pain as mild but no OOB activity as of yet  Objective:   VITALS:   Vitals:   01/06/20 0003 01/06/20 0404  BP: 105/60 (!) 108/53  Pulse: 89 80  Resp: 18 18  Temp: 98.2 F (36.8 C) 98.7 F (37.1 C)  SpO2: 95% 97%    Neurovascular intact Incision: dressing C/D/I, left hip and thigh  LABS Recent Labs    01/05/20 1205 01/06/20 0546  HGB 11.7* 10.2*  HCT 36.7 31.3*  WBC 9.0 7.7  PLT 224 205    Recent Labs    01/05/20 1205  NA 137  K 4.3  BUN 31*  CREATININE 0.97  GLUCOSE 220*    Recent Labs    01/05/20 1205  INR 1.0     Assessment/Plan: 1 Day Post-Op Procedure(s) (LRB): INTRAMEDULLARY (IM) NAIL INTERTROCHANTRIC (Left)   Advance diet Up with therapy  Disposition pending therapy evaluation Likely ST SNF at [facility] prior to returning to assisted living apartment DVT prophylaxis and WB status per Aundria Rud

## 2020-01-07 ENCOUNTER — Encounter: Payer: Self-pay | Admitting: *Deleted

## 2020-01-07 LAB — GLUCOSE, CAPILLARY
Glucose-Capillary: 114 mg/dL — ABNORMAL HIGH (ref 70–99)
Glucose-Capillary: 167 mg/dL — ABNORMAL HIGH (ref 70–99)
Glucose-Capillary: 165 mg/dL — ABNORMAL HIGH (ref 70–99)
Glucose-Capillary: 168 mg/dL — ABNORMAL HIGH (ref 70–99)

## 2020-01-07 LAB — CBC WITH DIFFERENTIAL/PLATELET
Abs Immature Granulocytes: 0.14 10*3/uL — ABNORMAL HIGH (ref 0.00–0.07)
Basophils Absolute: 0.1 10*3/uL (ref 0.0–0.1)
Basophils Relative: 1 %
Eosinophils Absolute: 0 10*3/uL (ref 0.0–0.5)
Eosinophils Relative: 0 %
HCT: 29.2 % — ABNORMAL LOW (ref 36.0–46.0)
Hemoglobin: 9.5 g/dL — ABNORMAL LOW (ref 12.0–15.0)
Immature Granulocytes: 2 %
Lymphocytes Relative: 16 %
Lymphs Abs: 1.3 10*3/uL (ref 0.7–4.0)
MCH: 30.4 pg (ref 26.0–34.0)
MCHC: 32.5 g/dL (ref 30.0–36.0)
MCV: 93.3 fL (ref 80.0–100.0)
Monocytes Absolute: 1.5 10*3/uL — ABNORMAL HIGH (ref 0.1–1.0)
Monocytes Relative: 19 %
Neutro Abs: 5.1 10*3/uL (ref 1.7–7.7)
Neutrophils Relative %: 62 %
Platelets: 179 10*3/uL (ref 150–400)
RBC: 3.13 MIL/uL — ABNORMAL LOW (ref 3.87–5.11)
RDW: 13.2 % (ref 11.5–15.5)
WBC: 8.2 10*3/uL (ref 4.0–10.5)
nRBC: 0 % (ref 0.0–0.2)

## 2020-01-07 MED ORDER — GLUCERNA SHAKE PO LIQD
237.0000 mL | Freq: Three times a day (TID) | ORAL | Status: DC
  Administered 2020-01-07: 237 mL via ORAL

## 2020-01-07 MED ORDER — HYDROCODONE-ACETAMINOPHEN 5-325 MG PO TABS: 1.0000 | ORAL_TABLET | Freq: Four times a day (QID) | ORAL | 0 refills | Status: AC | PRN

## 2020-01-07 MED ORDER — ADULT MULTIVITAMIN W/MINERALS CH
1.0000 | ORAL_TABLET | Freq: Every day | ORAL | Status: DC
  Administered 2020-01-07 – 2020-01-08 (×2): 1 via ORAL
  Filled 2020-01-07 (×2): qty 1

## 2020-01-07 NOTE — Progress Notes (Signed)
3/1: pasrr received, waiting on insurance auth [id number]) for SNF placement @ [name]. Gae Gallop RN,BSN,CM [phone]

## 2020-01-07 NOTE — Progress Notes (Signed)
Please be advised that the above-named patient will require a short-term nursing home stay-anticipated 30 days or less for rehabilitation and strengthening. The plan is for return home.  

## 2020-01-07 NOTE — Plan of Care (Signed)
  Problem: Activity: Goal: Ability to ambulate and perform ADLs will improve Outcome: Progressing Note: Up to chair for a few hours, with one assist and a walker, tolerated well   Problem: Pain Management: Goal: Pain level will decrease Outcome: Progressing Note: No complaints of pain    Problem: Clinical Measurements: Goal: Respiratory complications will improve Outcome: Progressing Note: On room air   Problem: Nutrition: Goal: Adequate nutrition will be maintained Outcome: Progressing

## 2020-01-07 NOTE — Evaluation (Signed)
Occupational Therapy Evaluation Patient Details Name: Kim Adams MRN: [id number] DOB: 12/13/1940 Today's Date: 01/07/2020    History of Present Illness  Kim Adams is a 79 y.o. female with medical history significant of IIDM, HTN, HLD presented with left pain after a mechanical fall, resutling in L femur fx, now s/p IM Nail, WBAT   Clinical Impression   PTA, pt was living at [facility], she reports she was independent with ADL/IADL and functional mobility. Pt currently requires moderate assistance to progress to EOB, modA for LB ADL and moderate assistance to stand pivot to the recliner. She is highly motivated to return to baseline and is very participatory with therapy. Due to decline in current level of function, pt would benefit from acute OT to address established goals to facilitate safe D/C to venue listed below. At this time, recommend SNF follow-up. Will continue to follow acutely.     Follow Up Recommendations  SNF;Supervision/Assistance - 24 hour    Equipment Recommendations  3 in 1 bedside commode    Recommendations for Other Services       Precautions / Restrictions Precautions Precautions: Fall Restrictions Weight Bearing Restrictions: Yes LLE Weight Bearing: Weight bearing as tolerated      Mobility Bed Mobility Overal bed mobility: Needs Assistance Bed Mobility: Supine to Sit     Supine to sit: Mod assist     General bed mobility comments: Cues for technique; Noted very nice half-bridge to EOB on her uninvolved R side; mod assist to elevate trunk to sit, and use of bed pad to square off hips at EOB  Transfers Overall transfer level: Needs assistance Equipment used: Rolling walker (2 wheeled) Transfers: Sit to/from [facility] Sit to Stand: Mod assist Stand pivot transfers: Mod assist       General transfer comment: modA to powerup into standing and for stability;modA for stand pivot for stability and cues     Balance Overall balance assessment: Needs assistance Sitting-balance support: No upper extremity supported;Feet supported Sitting balance-Leahy Scale: Fair Sitting balance - Comments: Antalgic lean, needing multimodal cueing for upright sitting   Standing balance support: Bilateral upper extremity supported Standing balance-Leahy Scale: Poor Standing balance comment: reliant on UE support                           ADL either performed or assessed with clinical judgement   ADL Overall ADL's : Needs assistance/impaired Eating/Feeding: Set up;Sitting   Grooming: Set up;Sitting   Upper Body Bathing: Set up;Sitting   Lower Body Bathing: Moderate assistance;Sit to/from stand   Upper Body Dressing : Minimal assistance;Sitting   Lower Body Dressing: Moderate assistance;Sit to/from stand   Toilet Transfer: Moderate assistance;Stand-pivot Toilet Transfer Details (indicate cue type and reason): simulated to recliner Toileting- Clothing Manipulation and Hygiene: Moderate assistance;Sit to/from stand       Functional mobility during ADLs: Moderate assistance;Rolling walker General ADL Comments: moderate assistance for stabiilty;pt limited by pain, weakness, decreased activity tolerance     Vision Patient Visual Report: No change from baseline Vision Assessment?: No apparent visual deficits     Perception     Praxis      Pertinent Vitals/Pain Pain Assessment: Faces Faces Pain Scale: Hurts even more Pain Location: L hip, especially with weight bearing Pain Descriptors / Indicators: Aching;Grimacing Pain Intervention(s): Monitored during session;Limited activity within patient's tolerance     Hand Dominance Right   Extremity/Trunk Assessment Upper Extremity Assessment Upper Extremity  Assessment: Generalized weakness   Lower Extremity Assessment Lower Extremity Assessment: Defer to PT evaluation   Cervical / Trunk Assessment Cervical / Trunk Assessment:  Kyphotic   Communication Communication Communication: HOH   Cognition Arousal/Alertness: Awake/alert Behavior During Therapy: WFL for tasks assessed/performed Overall Cognitive Status: No family/caregiver present to determine baseline cognitive functioning Area of Impairment: Memory;Problem solving                     Memory: Decreased short-term memory       Problem Solving: Requires tactile cues;Slow processing     General Comments  pt with red skin coloration pressure area on spine, RN notified     Exercises     Shoulder Instructions      Home Living Family/patient expects to be discharged to:: Skilled nursing facility                                 Additional Comments: Lives at [facility]; Tells me she had good outcomes after going to the Rehab Center at [facility] after her R Hip fracture a few years ago      Prior Functioning/Environment Level of Independence: Independent        Comments: Independent Living at [facility]; manages ADLs independently; does not drive        OT Problem List: Decreased strength;Decreased range of motion;Decreased activity tolerance;Impaired balance (sitting and/or standing);Decreased cognition;Decreased safety awareness;Decreased knowledge of use of DME or AE;Decreased knowledge of precautions;Pain      OT Treatment/Interventions: Self-care/ADL training;Therapeutic exercise;DME and/or AE instruction;Therapeutic activities;Cognitive remediation/compensation;Patient/family education;Balance training    OT Goals(Current goals can be found in the care plan section) Acute Rehab OT Goals Patient Stated Goal: to get therapy  OT Goal Formulation: With patient Time For Goal Achievement: 01/21/20 Potential to Achieve Goals: Good ADL Goals Pt Will Perform Grooming: with supervision;standing Pt Will Perform Lower Body Dressing: with supervision;sit to/from stand Pt Will Transfer to Toilet: with min guard  assist;ambulating Pt Will Perform Toileting - Clothing Manipulation and hygiene: with min guard assist;sit to/from stand  OT Frequency: Min 2X/week   Barriers to D/C: Decreased caregiver support          Co-evaluation              AM-PAC OT "6 Clicks" Daily Activity     Outcome Measure Help from another person eating meals?: A Little Help from another person taking care of personal grooming?: A Little Help from another person toileting, which includes using toliet, bedpan, or urinal?: A Lot Help from another person bathing (including washing, rinsing, drying)?: A Little Help from another person to put on and taking off regular upper body clothing?: A Little Help from another person to put on and taking off regular lower body clothing?: A Lot 6 Click Score: 16   End of Session Equipment Utilized During Treatment: Rolling walker;Gait belt Nurse Communication: Mobility status(pressure area on spine)  Activity Tolerance: Patient tolerated treatment well Patient left: in chair;with call bell/phone within reach;with chair alarm set  OT Visit Diagnosis: Unsteadiness on feet (R26.81);Muscle weakness (generalized) (M62.81);Other abnormalities of gait and mobility (R26.89);History of falling (Z91.81);Other symptoms and signs involving cognitive function;Pain Pain - Right/Left: Left Pain - part of body: Leg                Time: 8119-1478 OT Time Calculation (min): 17 min Charges:  OT General Charges $OT Visit: 1 Visit OT Evaluation $  OT Eval Moderate Complexity: 1 Mod  Diona Browner OTR/L Acute Rehabilitation Services Office: [phone]   Rebeca Alert 01/07/2020, 3:31 PM

## 2020-01-07 NOTE — Progress Notes (Signed)
PROGRESS NOTE    Kim Adams  OZH:[id number] DOB: 10/29/1941 DOA: 01/05/2020 PCP: Merlene Laughter, MD   Brief Narrative: Patient is a 79 year old female with history of diabetes mellitus, hypertension, hyperlipidemia who presented with left hip pain after a mechanical fall at independent living facility she was walking with her dog, lost her balance and fell.  No loss of consciousness.  X-ray done in the emergency department showed left hip fracture.  Orthopedics consulted and she underwent intramedullary nailing by orthopedics.  PT recommended skilled nursing facility.  She is hemodynamically stable for discharge to skilled nursing facility as soon as bed is available.  Assessment & Plan:   Active Problems:   Femur fracture, left (HCC)   Closed left hip fracture, initial encounter (HCC)  Left intertrochanteric fracture: Mechanical fall.  Orthopedics did intramedullary nailing on 01/05/2020.  Continue pain management.  PT/OT consulted.  She lives in independent living facility at [facility].  PT recommended skilled nursing facility. On baseline,she ambulates without the help of walker or cane.DVT ppx as per ortho  Type 2 diabetes mellitus: On oral diabetic medication at home.  Continue sliding scale insulin for now.  Monitor CBGs  Hypertension: Currently blood pressure stable.  Continue home medications  Hyperlipidemia: On statin         DVT prophylaxis: Lovenox Code Status: Full code Family Communication: Called sister at phone on 01/06/20 Disposition Plan: Patient is from independent living facility.  PT recommended skilled nursing facility.  She is hemodynamically stable for discharge soon as bed is available.   Consultants: Orthopedics  Procedures: Intramedullary nailing  Antimicrobials:  Anti-infectives (From admission, onward)   Start     Dose/Rate Route Frequency Ordered Stop   01/05/20 2200  ceFAZolin (ANCEF) IVPB 1 g/50 mL premix     1 g 100 mL/hr over 30 Minutes  Intravenous Every 6 hours 01/05/20 1724 01/06/20 0905   01/05/20 1500  ceFAZolin (ANCEF) IVPB 2g/100 mL premix     2 g 200 mL/hr over 30 Minutes Intravenous On call to O.R. 01/05/20 1410 01/05/20 1531   01/05/20 1412  ceFAZolin (ANCEF) 2-4 GM/100ML-% IVPB    Note to Pharmacy: Shireen Quan   : cabinet override      01/05/20 1412 01/05/20 1544      Subjective:  Patient seen and examined the bedside this morning.  Hemodynamically stable.  No active issues.  Pain well controlled. Waiting for PASSR  skilled nursing facility placement.  Objective: Vitals:   01/06/20 1936 01/07/20 0333 01/07/20 0741 01/07/20 1035  BP: (!) 97/51 (!) 102/56 101/61 111/61  Pulse: 82 89 96 99  Resp: 18 15 17 18   Temp: 99.7 F (37.6 C) 98.9 F (37.2 C) 97.7 F (36.5 C)   TempSrc: Oral Oral Oral   SpO2: 94% 95% 93% 95%  Weight:      Height:        Intake/Output Summary (Last 24 hours) at 01/07/2020 1114 Last data filed at 01/07/2020 0900 Gross per 24 hour  Intake 360 ml  Output 1350 ml  Net -990 ml   Filed Weights   01/05/20 1029  Weight: 56.2 kg    Examination:  General exam: Appears calm and comfortable ,Not in distress, pleasant elderly female Respiratory system: Bilateral equal air entry, normal vesicular breath sounds, no wheezes or crackles  Cardiovascular system: S1 & S2 heard, RRR. No JVD, murmurs, rubs, gallops or clicks. Gastrointestinal system: Abdomen is nondistended, soft and nontender. No organomegaly or masses felt. Normal bowel sounds heard.  Central nervous system: Alert and oriented. No focal neurological deficits. Extremities: No edema, no clubbing ,no cyanosis, clean surgical wound on the left hip  skin: No rashes, lesions or ulcers,no icterus ,no pallor   Data Reviewed: I have personally reviewed following labs and imaging studies  CBC: Recent Labs  Lab 01/05/20 1205 01/06/20 0546 01/07/20 0203  WBC 9.0 7.7 8.2  NEUTROABS 7.3  --  5.1  HGB 11.7* 10.2* 9.5*  HCT 36.7  31.3* 29.2*  MCV 93.9 94.8 93.3  PLT 224 205 179   Basic Metabolic Panel: Recent Labs  Lab 01/05/20 1205  NA 137  K 4.3  CL 103  CO2 24  GLUCOSE 220*  BUN 31*  CREATININE 0.97  CALCIUM 9.3   GFR: Estimated Creatinine Clearance: 42.4 mL/min (by C-G formula based on SCr of 0.97 mg/dL). Liver Function Tests: No results for input(s): AST, ALT, ALKPHOS, BILITOT, PROT, ALBUMIN in the last 168 hours. No results for input(s): LIPASE, AMYLASE in the last 168 hours. No results for input(s): AMMONIA in the last 168 hours. Coagulation Profile: Recent Labs  Lab 01/05/20 1205  INR 1.0   Cardiac Enzymes: No results for input(s): CKTOTAL, CKMB, CKMBINDEX, TROPONINI in the last 168 hours. BNP (last 3 results) No results for input(s): PROBNP in the last 8760 hours. HbA1C: Recent Labs    01/05/20 1232  HGBA1C 9.0*   CBG: Recent Labs  Lab 01/05/20 1650 01/06/20 1145 01/06/20 1643 01/06/20 1936 01/07/20 0641  GLUCAP 141* 194* 149* 177* 114*   Lipid Profile: No results for input(s): CHOL, HDL, LDLCALC, TRIG, CHOLHDL, LDLDIRECT in the last 72 hours. Thyroid Function Tests: No results for input(s): TSH, T4TOTAL, FREET4, T3FREE, THYROIDAB in the last 72 hours. Anemia Panel: No results for input(s): VITAMINB12, FOLATE, FERRITIN, TIBC, IRON, RETICCTPCT in the last 72 hours. Sepsis Labs: No results for input(s): PROCALCITON, LATICACIDVEN in the last 168 hours.  Recent Results (from the past 240 hour(s))  Respiratory Panel by RT PCR (Flu A&B, Covid) - Nasopharyngeal Swab     Status: None   Collection Time: 01/05/20 12:49 PM   Specimen: Nasopharyngeal Swab  Result Value Ref Range Status   SARS Coronavirus 2 by RT PCR NEGATIVE NEGATIVE Final    Comment: (NOTE) SARS-CoV-2 target nucleic acids are NOT DETECTED. The SARS-CoV-2 RNA is generally detectable in upper respiratoy specimens during the acute phase of infection. The lowest concentration of SARS-CoV-2 viral copies this assay  can detect is 131 copies/mL. A negative result does not preclude SARS-Cov-2 infection and should not be used as the sole basis for treatment or other patient management decisions. A negative result may occur with  improper specimen collection/handling, submission of specimen other than nasopharyngeal swab, presence of viral mutation(s) within the areas targeted by this assay, and inadequate number of viral copies (<131 copies/mL). A negative result must be combined with clinical observations, patient history, and epidemiological information. The expected result is Negative. Fact Sheet for Patients:  https://www.moore.com/ Fact Sheet for Healthcare Providers:  https://www.young.biz/ This test is not yet ap proved or cleared by the [place] FDA and  has been authorized for detection and/or diagnosis of SARS-CoV-2 by FDA under an Emergency Use Authorization (EUA). This EUA will remain  in effect (meaning this test can be used) for the duration of the COVID-19 declaration under Section 564(b)(1) of the Act, 21 U.S.C. section 360bbb-3(b)(1), unless the authorization is terminated or revoked sooner.    Influenza A by PCR NEGATIVE NEGATIVE Final   Influenza  B by PCR NEGATIVE NEGATIVE Final    Comment: (NOTE) The Xpert Xpress SARS-CoV-2/FLU/RSV assay is intended as an aid in  the diagnosis of influenza from Nasopharyngeal swab specimens and  should not be used as a sole basis for treatment. Nasal washings and  aspirates are unacceptable for Xpert Xpress SARS-CoV-2/FLU/RSV  testing. Fact Sheet for Patients: https://www.moore.com/ Fact Sheet for Healthcare Providers: https://www.young.biz/ This test is not yet approved or cleared by the [place] FDA and  has been authorized for detection and/or diagnosis of SARS-CoV-2 by  FDA under an Emergency Use Authorization (EUA). This EUA will remain  in effect  (meaning this test can be used) for the duration of the  Covid-19 declaration under Section 564(b)(1) of the Act, 21  U.S.C. section 360bbb-3(b)(1), unless the authorization is  terminated or revoked. Performed at [facility], [address] [place], [place] [address]   SARS CORONAVIRUS 2 (TAT 6-24 HRS) Nasopharyngeal Nasopharyngeal Swab     Status: None   Collection Time: 01/06/20  2:46 PM   Specimen: Nasopharyngeal Swab  Result Value Ref Range Status   SARS Coronavirus 2 NEGATIVE NEGATIVE Final    Comment: (NOTE) SARS-CoV-2 target nucleic acids are NOT DETECTED. The SARS-CoV-2 RNA is generally detectable in upper and lower respiratory specimens during the acute phase of infection. Negative results do not preclude SARS-CoV-2 infection, do not rule out co-infections with other pathogens, and should not be used as the sole basis for treatment or other patient management decisions. Negative results must be combined with clinical observations, patient history, and epidemiological information. The expected result is Negative. Fact Sheet for Patients: HairSlick.no Fact Sheet for Healthcare Providers: quierodirigir.com This test is not yet approved or cleared by the [place] FDA and  has been authorized for detection and/or diagnosis of SARS-CoV-2 by FDA under an Emergency Use Authorization (EUA). This EUA will remain  in effect (meaning this test can be used) for the duration of the COVID-19 declaration under Section 56 4(b)(1) of the Act, 21 U.S.C. section 360bbb-3(b)(1), unless the authorization is terminated or revoked sooner. Performed at [facility], [address], [place], [place] [address]          Radiology Studies: DG Chest [place] 1 View  Result Date: 01/05/2020 CLINICAL DATA:  79 year old female undergoing preoperative evaluation prior to repair of left proximal femoral fracture. EXAM: PORTABLE  CHEST 1 VIEW COMPARISON:  Prior chest x-ray 11/01/2017 FINDINGS: Stable cardiac and mediastinal contours. Atherosclerotic calcifications are present in the transverse aorta. Emphysematous changes noted in the upper lungs. No focal airspace consolidation, pulmonary edema, pleural effusion or pneumothorax. No acute osseous abnormality. IMPRESSION: 1. No acute cardiopulmonary process. 2. Pulmonary emphysema. Electronically Signed   By: Malachy Moan M.D.   On: 01/05/2020 12:12   DG C-Arm 1-60 Min  Result Date: 01/05/2020 CLINICAL DATA:  Left hip fracture EXAM: DG C-ARM 1-60 MIN FLUOROSCOPY TIME:  Fluoroscopy Time:  47 seconds Number of Acquired Spot Images: 4 COMPARISON:  01/05/2020 FINDINGS: The patient has undergone intramedullary nail placement through the proximal left femur. The osseous alignment is improved. The hardware is intact. Again noted is an intratrochanteric fracture of the proximal left femur. Vascular calcifications are noted. IMPRESSION: Status post ORIF with improved osseous alignment. Electronically Signed   By: Katherine Mantle M.D.   On: 01/05/2020 17:13   DG HIP OPERATIVE UNILAT W OR W/O PELVIS LEFT  Result Date: 01/05/2020 CLINICAL DATA:  Intramedullary nail placement EXAM: OPERATIVE LEFT HIP (  WITH PELVIS IF PERFORMED) 4 VIEWS TECHNIQUE: Fluoroscopic spot image(s) were submitted for interpretation post-operatively. COMPARISON:  01/05/2020 FINDINGS: The patient has undergone intramedullary nail placement through the proximal left femur. The osseous alignment is improved. The hardware is intact. Again noted is an intratrochanteric fracture of the proximal left femur. Vascular calcifications are noted. IMPRESSION: Status post ORIF with improved osseous alignment. Electronically Signed   By: Constance Holster M.D.   On: 01/05/2020 17:13        Scheduled Meds: . calcium-vitamin D  1 tablet Oral Daily  . docusate sodium  100 mg Oral BID  . enalapril  5 mg Oral Daily  .  enoxaparin (LOVENOX) injection  30 mg Subcutaneous Q24H  . ferrous sulfate  325 mg Oral Q M,W,F  . furosemide  20 mg Oral Daily  . insulin aspart  0-9 Units Subcutaneous TID WC  . labetalol  100 mg Oral BID  . polyethylene glycol  17 g Oral Daily  . senna  1 tablet Oral Daily  . simvastatin  10 mg Oral Daily   Continuous Infusions: . lactated ringers 10 mL/hr at 01/05/20 1510     LOS: 2 days    Time spent:25 mins. More than 50% of that time was spent in counseling and/or coordination of care.      Shelly Coss, MD [facility] P3/11/2019, 11:14 AM

## 2020-01-07 NOTE — Progress Notes (Signed)
Initial Nutrition Assessment  DOCUMENTATION CODES:   Not applicable  INTERVENTION:   -Glucerna Shake po TID, each supplement provides 220 kcal and 10 grams of protein -MVI with minerals daily  NUTRITION DIAGNOSIS:   Increased nutrient needs related to post-op healing as evidenced by estimated needs.  GOAL:   Patient will meet greater than or equal to 90% of their needs  MONITOR:   PO intake, Supplement acceptance, Labs, Weight trends, Skin, I & O's  REASON FOR ASSESSMENT:   Consult Assessment of nutrition requirement/status  ASSESSMENT:   Kim Adams is a 79 y.o. female with medical history significant of IIDM, HTN, HLD presented with left pain after a mechanical fall.  This morning while walking her dog, her left foot tripped on a rug and then she lost her balance and fell down on the left hip, severe pain.  Loss of consciousness, no prodrome of relation work but headache.  Denies pain, other joint or muscle.  Had a similar episode last year, causing fracture of the right hip.  Her baseline mobility phase fairly good, she ambulated without any cane or walker.  Not any short of breath or chest pain..ED Course: Left intertrochanteric versus basal cervical pressure.  Pt admitted with lt hip fracture.   2/27- s/p PROCEDURE: Treatment of intertrochanteric, pertrochanteric, subtrochanteric fracture with intramedullary implant. CPT [phone]   Reviewed I/O's: -990 ml x 24 hours and -747 ml since admission  UOP: 1.4 L x 24 hours  Pt sleeping soundly at time of visit. She did not respond to voice or touch. Observed breakfast tray- pt consumed only one bite of pancake.   Overall, pt with good oral intake. Noted meal completion 50-100%.   Per CSW notes, plan to d/c to SNF once insurance authorization is obtained.   Lab Results  Component Value Date   HGBA1C 9.0 (H) 01/05/2020   PTA DM medications are 100 mg sitagliptin daily and 850 mg metformin daily.   Labs reviewed: CBGS:  114-177 (inpatient orders for glycemic control are 0-9 units inuslin aspart TID with meals).   NUTRITION - FOCUSED PHYSICAL EXAM:    Most Recent Value  Orbital Region  No depletion  Upper Arm Region  No depletion  Thoracic and Lumbar Region  No depletion  Buccal Region  No depletion  Temple Region  No depletion  Clavicle Bone Region  No depletion  Clavicle and Acromion Bone Region  No depletion  Scapular Bone Region  No depletion  Dorsal Hand  No depletion  Patellar Region  No depletion  Anterior Thigh Region  No depletion  Posterior Calf Region  No depletion  Edema (RD Assessment)  None  Hair  Reviewed  Eyes  Reviewed  Mouth  Reviewed  Skin  Reviewed  Nails  Reviewed       Diet Order:   Diet Order            Diet Carb Modified Fluid consistency: Thin; Room service appropriate? Yes  Diet effective now              EDUCATION NEEDS:   No education needs have been identified at this time  Skin:  Skin Assessment: Skin Integrity Issues: Skin Integrity Issues:: Incisions Incisions: closed lt hip  Last BM:  01/05/20  Height:   Ht Readings from Last 1 Encounters:  01/05/20 5\' 7"  (1.702 m)    Weight:   Wt Readings from Last 1 Encounters:  01/05/20 56.2 kg    Ideal Body Weight:  61.4  kg  BMI:  Body mass index is 19.42 kg/m.  Estimated Nutritional Needs:   Kcal:  1500-1700  Protein:  70-85 grams  Fluid:  > 1.5 L    Kim Adams, RD, LDN, [place] Registered Dietitian II Certified Diabetes Care and Education Specialist Please refer to [facility] for RD and/or RD on-call/weekend/after hours pager

## 2020-01-07 NOTE — Progress Notes (Signed)
PASSR pending for SNF placement ... TOC team will continue to monitor. Gae Gallop RN,BSN,CM [phone]

## 2020-01-08 DIAGNOSIS — D500 Iron deficiency anemia secondary to blood loss (chronic): Secondary | ICD-10-CM | POA: Diagnosis not present

## 2020-01-08 DIAGNOSIS — R52 Pain, unspecified: Secondary | ICD-10-CM | POA: Diagnosis not present

## 2020-01-08 DIAGNOSIS — S79929A Unspecified injury of unspecified thigh, initial encounter: Secondary | ICD-10-CM | POA: Diagnosis not present

## 2020-01-08 DIAGNOSIS — I10 Essential (primary) hypertension: Secondary | ICD-10-CM | POA: Diagnosis not present

## 2020-01-08 DIAGNOSIS — R2689 Other abnormalities of gait and mobility: Secondary | ICD-10-CM | POA: Diagnosis not present

## 2020-01-08 DIAGNOSIS — E118 Type 2 diabetes mellitus with unspecified complications: Secondary | ICD-10-CM | POA: Diagnosis not present

## 2020-01-08 DIAGNOSIS — M80052D Age-related osteoporosis with current pathological fracture, left femur, subsequent encounter for fracture with routine healing: Secondary | ICD-10-CM | POA: Diagnosis not present

## 2020-01-08 DIAGNOSIS — I959 Hypotension, unspecified: Secondary | ICD-10-CM | POA: Diagnosis not present

## 2020-01-08 DIAGNOSIS — M6281 Muscle weakness (generalized): Secondary | ICD-10-CM | POA: Diagnosis not present

## 2020-01-08 DIAGNOSIS — S72002A Fracture of unspecified part of neck of left femur, initial encounter for closed fracture: Secondary | ICD-10-CM | POA: Diagnosis not present

## 2020-01-08 DIAGNOSIS — Z7409 Other reduced mobility: Secondary | ICD-10-CM | POA: Diagnosis not present

## 2020-01-08 DIAGNOSIS — Z743 Need for continuous supervision: Secondary | ICD-10-CM | POA: Diagnosis not present

## 2020-01-08 DIAGNOSIS — S7292XD Unspecified fracture of left femur, subsequent encounter for closed fracture with routine healing: Secondary | ICD-10-CM | POA: Diagnosis not present

## 2020-01-08 DIAGNOSIS — R279 Unspecified lack of coordination: Secondary | ICD-10-CM | POA: Diagnosis not present

## 2020-01-08 DIAGNOSIS — Z4789 Encounter for other orthopedic aftercare: Secondary | ICD-10-CM | POA: Diagnosis not present

## 2020-01-08 DIAGNOSIS — K5900 Constipation, unspecified: Secondary | ICD-10-CM | POA: Diagnosis not present

## 2020-01-08 DIAGNOSIS — S7292XA Unspecified fracture of left femur, initial encounter for closed fracture: Secondary | ICD-10-CM | POA: Diagnosis not present

## 2020-01-08 DIAGNOSIS — D649 Anemia, unspecified: Secondary | ICD-10-CM | POA: Diagnosis not present

## 2020-01-08 DIAGNOSIS — E119 Type 2 diabetes mellitus without complications: Secondary | ICD-10-CM | POA: Diagnosis not present

## 2020-01-08 DIAGNOSIS — G8911 Acute pain due to trauma: Secondary | ICD-10-CM | POA: Diagnosis not present

## 2020-01-08 DIAGNOSIS — E785 Hyperlipidemia, unspecified: Secondary | ICD-10-CM | POA: Diagnosis not present

## 2020-01-08 DIAGNOSIS — R609 Edema, unspecified: Secondary | ICD-10-CM | POA: Diagnosis not present

## 2020-01-08 DIAGNOSIS — Z9181 History of falling: Secondary | ICD-10-CM | POA: Diagnosis not present

## 2020-01-08 DIAGNOSIS — J029 Acute pharyngitis, unspecified: Secondary | ICD-10-CM | POA: Diagnosis not present

## 2020-01-08 LAB — GLUCOSE, CAPILLARY: Glucose-Capillary: 132 mg/dL — ABNORMAL HIGH (ref 70–99)

## 2020-01-08 MED ORDER — POLYETHYLENE GLYCOL 3350 17 G PO PACK: 17.0000 g | PACK | Freq: Every day | ORAL | 0 refills | Status: AC

## 2020-01-08 MED ORDER — SENNA 8.6 MG PO TABS: 1.0000 | ORAL_TABLET | Freq: Every day | ORAL | 0 refills | Status: AC

## 2020-01-08 MED ORDER — ENOXAPARIN SODIUM 30 MG/0.3ML ~~LOC~~ SOLN: 30.0000 mg | SUBCUTANEOUS | Status: DC

## 2020-01-08 NOTE — Discharge Summary (Signed)
Physician Discharge Summary  Kim Adams KZL:[id number] DOB: Apr 09, 1941 DOA: 01/05/2020  PCP: Merlene Laughter, MD  Admit date: 01/05/2020 Discharge date: 01/08/2020  Admitted From: ILF Disposition:  SNF  Discharge Condition:Stable CODE STATUS:FULL Diet recommendation: Heart Healthy  Brief/Interim Summary: Patient is a 79 year old female with history of diabetes mellitus, hypertension, hyperlipidemia who presented with left hip pain after a mechanical fall at independent living facility she was walking with her dog, lost her balance and fell.  No loss of consciousness.  X-ray done in the emergency department showed left hip fracture.  Orthopedics consulted and she underwent intramedullary nailing by orthopedics.  PT recommended skilled nursing facility.She is hemodynamically stable for discharge today.  Following problems were addressed during her hopitalization:   Left intertrochanteric fracture: Mechanical fall.  Orthopedics did intramedullary nailing on 01/05/2020.  Continue pain management.   She lives in independent living facility at [facility].  PT recommended skilled nursing facility. On baseline,she ambulates without the help of walker or cane.Continue lovenox for 4 weeks for Dvt ppx. Continue bowel regimen.  Type 2 diabetes mellitus: On oral diabetic medication at home.  Hypertension: Currently blood pressure stable.  Continue home medications  Hyperlipidemia: On statin  Discharge Diagnoses:  Active Problems:   Femur fracture, left (HCC)   Closed left hip fracture, initial encounter [facility])    Discharge Instructions  Discharge Instructions    Diet - low sodium heart healthy   Complete by: As directed    Discharge instructions   Complete by: As directed    1)Follow up with orthopedics in 2 weeks for staples removal. 2)Do a CBC test in a week to check your hemoglobin. 3)Take medications as instructed.   Increase activity slowly   Complete by: As directed       Allergies as of 01/08/2020      Reactions   Fosamax [alendronate Sodium] Other (See Comments)   Esophageal problems      Medication List    TAKE these medications   Calcium/Vitamin D/Minerals 600-200 MG-UNIT Tabs Take 1 tablet by mouth daily.   enalapril 5 MG tablet Commonly known as: VASOTEC Take 5 mg by mouth daily.   enoxaparin 30 MG/0.3ML injection Commonly known as: LOVENOX Inject 0.3 mLs (30 mg total) into the skin daily for 28 days.   ferrous sulfate 325 (65 FE) MG tablet Take 325 mg by mouth every Monday, Wednesday, and Friday.   furosemide 20 MG tablet Commonly known as: LASIX Take 20 mg by mouth daily.   HYDROcodone-acetaminophen 5-325 MG tablet Commonly known as: NORCO/VICODIN Take 1-2 tablets by mouth every 6 (six) hours as needed for moderate pain.   Jardiance 10 MG Tabs tablet Generic drug: empagliflozin Take 10 mg by mouth daily.   labetalol 100 MG tablet Commonly known as: NORMODYNE Take 100 mg by mouth 2 (two) times daily.   metFORMIN 1000 MG tablet Commonly known as: GLUCOPHAGE Take 1 tablet by mouth daily.   polyethylene glycol 17 g packet Commonly known as: MIRALAX / GLYCOLAX Take 17 g by mouth daily.   senna 8.6 MG Tabs tablet Commonly known as: SENOKOT Take 1 tablet (8.6 mg total) by mouth daily.   simvastatin 10 MG tablet Commonly known as: ZOCOR Take 1 tablet (10 mg total) by mouth daily at 6 PM.      Follow-up Information    Yolonda Kida, MD. Schedule an appointment as soon as possible for a visit in 2 week(s).   Specialty: Orthopedic Surgery Contact information: [address]  [address] [place] [address] [phone]          Allergies  Allergen Reactions  . Fosamax [Alendronate Sodium] Other (See Comments)    Esophageal problems    Consultations:  Orthopedics   Procedures/Studies: DG Pelvis 1-2 Views  Result Date: 01/05/2020 CLINICAL DATA:  Acute LEFT hip pain following fall. Initial  encounter. EXAM: PELVIS - 1-2 VIEW COMPARISON:  11/01/2017 radiograph FINDINGS: An intertrochanteric fracture of the LEFT femur is noted with varus angulation. No dislocation. Remote fracture of the proximal RIGHT femur with internal fixation hardware and remote fracture of the INFERIOR RIGHT pubic ramus again noted. No focal bony lesions are identified. IMPRESSION: Intertrochanteric LEFT femur fracture with varus angulation. Electronically Signed   By: Margarette Canada M.D.   On: 01/05/2020 11:12   DG Chest Port 1 View  Result Date: 01/05/2020 CLINICAL DATA:  79 year old female undergoing preoperative evaluation prior to repair of left proximal femoral fracture. EXAM: PORTABLE CHEST 1 VIEW COMPARISON:  Prior chest x-ray 11/01/2017 FINDINGS: Stable cardiac and mediastinal contours. Atherosclerotic calcifications are present in the transverse aorta. Emphysematous changes noted in the upper lungs. No focal airspace consolidation, pulmonary edema, pleural effusion or pneumothorax. No acute osseous abnormality. IMPRESSION: 1. No acute cardiopulmonary process. 2. Pulmonary emphysema. Electronically Signed   By: Jacqulynn Cadet M.D.   On: 01/05/2020 12:12   DG C-Arm 1-60 Min  Result Date: 01/05/2020 CLINICAL DATA:  Left hip fracture EXAM: DG C-ARM 1-60 MIN FLUOROSCOPY TIME:  Fluoroscopy Time:  47 seconds Number of Acquired Spot Images: 4 COMPARISON:  01/05/2020 FINDINGS: The patient has undergone intramedullary nail placement through the proximal left femur. The osseous alignment is improved. The hardware is intact. Again noted is an intratrochanteric fracture of the proximal left femur. Vascular calcifications are noted. IMPRESSION: Status post ORIF with improved osseous alignment. Electronically Signed   By: Constance Holster M.D.   On: 01/05/2020 17:13   DG HIP OPERATIVE UNILAT W OR W/O PELVIS LEFT  Result Date: 01/05/2020 CLINICAL DATA:  Intramedullary nail placement EXAM: OPERATIVE LEFT HIP (WITH PELVIS IF  PERFORMED) 4 VIEWS TECHNIQUE: Fluoroscopic spot image(s) were submitted for interpretation post-operatively. COMPARISON:  01/05/2020 FINDINGS: The patient has undergone intramedullary nail placement through the proximal left femur. The osseous alignment is improved. The hardware is intact. Again noted is an intratrochanteric fracture of the proximal left femur. Vascular calcifications are noted. IMPRESSION: Status post ORIF with improved osseous alignment. Electronically Signed   By: Constance Holster M.D.   On: 01/05/2020 17:13   DG Femur Min 2 Views Left  Addendum Date: 01/05/2020   ADDENDUM REPORT: 01/05/2020 11:26 ADDENDUM: The fracture may actually be basicervical instead of intertrochanteric. Electronically Signed   By: Margarette Canada M.D.   On: 01/05/2020 11:26   Result Date: 01/05/2020 CLINICAL DATA:  Acute LEFT leg pain following fall. Initial encounter. EXAM: LEFT FEMUR 2 VIEWS COMPARISON:  11/01/2017 FINDINGS: An intertrochanteric fracture of the proximal LEFT femur is noted with varus angulation. No dislocation. No other acute bony abnormalities noted. IMPRESSION: Intertrochanteric LEFT femur fracture with varus angulation. Electronically Signed: By: Margarette Canada M.D. On: 01/05/2020 11:14       Subjective: Patient seen and examined at the bedside this morning.Hemodynamically stable for discharge.  Discharge Exam: Vitals:   01/08/20 0426 01/08/20 0806  BP: 107/61 (!) 102/54  Pulse: 86 (!) 101  Resp: 16 18  Temp: 98.5 F (36.9 C) 97.9 F (36.6 C)  SpO2: 97% 98%   Vitals:   01/07/20  1941 01/08/20 0150 01/08/20 0426 01/08/20 0806  BP: 103/61 (!) 92/58 107/61 (!) 102/54  Pulse: 92 87 86 (!) 101  Resp: 16 16 16 18   Temp: (!) 100.4 F (38 C) 98.6 F (37 C) 98.5 F (36.9 C) 97.9 F (36.6 C)  TempSrc: Oral Oral Oral Oral  SpO2: 96% 96% 97% 98%  Weight:      Height:        General: Pt is alert, awake, not in acute distress Cardiovascular: RRR, S1/S2 +, no rubs, no  gallops Respiratory: CTA bilaterally, no wheezing, no rhonchi Abdominal: Soft, NT, ND, bowel sounds + Extremities: no edema, no cyanosis    The results of significant diagnostics from this hospitalization (including imaging, microbiology, ancillary and laboratory) are listed below for reference.     Microbiology: Recent Results (from the past 240 hour(s))  Respiratory Panel by RT PCR (Flu A&B, Covid) - Nasopharyngeal Swab     Status: None   Collection Time: 01/05/20 12:49 PM   Specimen: Nasopharyngeal Swab  Result Value Ref Range Status   SARS Coronavirus 2 by RT PCR NEGATIVE NEGATIVE Final    Comment: (NOTE) SARS-CoV-2 target nucleic acids are NOT DETECTED. The SARS-CoV-2 RNA is generally detectable in upper respiratoy specimens during the acute phase of infection. The lowest concentration of SARS-CoV-2 viral copies this assay can detect is 131 copies/mL. A negative result does not preclude SARS-Cov-2 infection and should not be used as the sole basis for treatment or other patient management decisions. A negative result may occur with  improper specimen collection/handling, submission of specimen other than nasopharyngeal swab, presence of viral mutation(s) within the areas targeted by this assay, and inadequate number of viral copies (<131 copies/mL). A negative result must be combined with clinical observations, patient history, and epidemiological information. The expected result is Negative. Fact Sheet for Patients:  https://www.moore.com/https://www.fda.gov/media/142436/download Fact Sheet for Healthcare Providers:  https://www.young.biz/https://www.fda.gov/media/142435/download This test is not yet ap proved or cleared by the [place] FDA and  has been authorized for detection and/or diagnosis of SARS-CoV-2 by FDA under an Emergency Use Authorization (EUA). This EUA will remain  in effect (meaning this test can be used) for the duration of the COVID-19 declaration under Section 564(b)(1) of the Act, 21  U.S.C. section 360bbb-3(b)(1), unless the authorization is terminated or revoked sooner.    Influenza A by PCR NEGATIVE NEGATIVE Final   Influenza B by PCR NEGATIVE NEGATIVE Final    Comment: (NOTE) The Xpert Xpress SARS-CoV-2/FLU/RSV assay is intended as an aid in  the diagnosis of influenza from Nasopharyngeal swab specimens and  should not be used as a sole basis for treatment. Nasal washings and  aspirates are unacceptable for Xpert Xpress SARS-CoV-2/FLU/RSV  testing. Fact Sheet for Patients: https://www.moore.com/https://www.fda.gov/media/142436/download Fact Sheet for Healthcare Providers: https://www.young.biz/https://www.fda.gov/media/142435/download This test is not yet approved or cleared by the [place] FDA and  has been authorized for detection and/or diagnosis of SARS-CoV-2 by  FDA under an Emergency Use Authorization (EUA). This EUA will remain  in effect (meaning this test can be used) for the duration of the  Covid-19 declaration under Section 564(b)(1) of the Act, 21  U.S.C. section 360bbb-3(b)(1), unless the authorization is  terminated or revoked. Performed at [facility], [address] [place], [place] [id number]   SARS CORONAVIRUS 2 (TAT 6-24 HRS) Nasopharyngeal Nasopharyngeal Swab     Status: None   Collection Time: 01/06/20  2:46 PM   Specimen: Nasopharyngeal Swab  Result Value Ref Range Status  SARS Coronavirus 2 NEGATIVE NEGATIVE Final    Comment: (NOTE) SARS-CoV-2 target nucleic acids are NOT DETECTED. The SARS-CoV-2 RNA is generally detectable in upper and lower respiratory specimens during the acute phase of infection. Negative results do not preclude SARS-CoV-2 infection, do not rule out co-infections with other pathogens, and should not be used as the sole basis for treatment or other patient management decisions. Negative results must be combined with clinical observations, patient history, and epidemiological information. The expected result is Negative. Fact Sheet for  Patients: HairSlick.no Fact Sheet for Healthcare Providers: quierodirigir.com This test is not yet approved or cleared by the [place] FDA and  has been authorized for detection and/or diagnosis of SARS-CoV-2 by FDA under an Emergency Use Authorization (EUA). This EUA will remain  in effect (meaning this test can be used) for the duration of the COVID-19 declaration under Section 56 4(b)(1) of the Act, 21 U.S.C. section 360bbb-3(b)(1), unless the authorization is terminated or revoked sooner. Performed at [facility], [address] [place], [place] [address]      Labs: BNP (last 3 results) No results for input(s): BNP in the last 8760 hours. Basic Metabolic Panel: Recent Labs  Lab 01/05/20 1205  NA 137  K 4.3  CL 103  CO2 24  GLUCOSE 220*  BUN 31*  CREATININE 0.97  CALCIUM 9.3   Liver Function Tests: No results for input(s): AST, ALT, ALKPHOS, BILITOT, PROT, ALBUMIN in the last 168 hours. No results for input(s): LIPASE, AMYLASE in the last 168 hours. No results for input(s): AMMONIA in the last 168 hours. CBC: Recent Labs  Lab 01/05/20 1205 01/06/20 0546 01/07/20 0203  WBC 9.0 7.7 8.2  NEUTROABS 7.3  --  5.1  HGB 11.7* 10.2* 9.5*  HCT 36.7 31.3* 29.2*  MCV 93.9 94.8 93.3  PLT 224 205 179   Cardiac Enzymes: No results for input(s): CKTOTAL, CKMB, CKMBINDEX, TROPONINI in the last 168 hours. BNP: Invalid input(s): POCBNP CBG: Recent Labs  Lab 01/07/20 0641 01/07/20 1138 01/07/20 1641 01/07/20 1942 01/08/20 0743  GLUCAP 114* 167* 165* 168* 132*   D-Dimer No results for input(s): DDIMER in the last 72 hours. Hgb A1c Recent Labs    01/05/20 1232  HGBA1C 9.0*   Lipid Profile No results for input(s): CHOL, HDL, LDLCALC, TRIG, CHOLHDL, LDLDIRECT in the last 72 hours. Thyroid function studies No results for input(s): TSH, T4TOTAL, T3FREE, THYROIDAB in the last 72 hours.  Invalid  input(s): FREET3 Anemia work up No results for input(s): VITAMINB12, FOLATE, FERRITIN, TIBC, IRON, RETICCTPCT in the last 72 hours. Urinalysis    Component Value Date/Time   COLORURINE YELLOW 11/03/2017 0646   APPEARANCEUR CLEAR 11/03/2017 0646   LABSPEC 1.023 11/03/2017 0646   PHURINE 5.0 11/03/2017 0646   GLUCOSEU 50 (A) 11/03/2017 0646   HGBUR NEGATIVE 11/03/2017 0646   BILIRUBINUR NEGATIVE 11/03/2017 0646   KETONESUR 5 (A) 11/03/2017 0646   PROTEINUR NEGATIVE 11/03/2017 0646   NITRITE NEGATIVE 11/03/2017 0646   LEUKOCYTESUR NEGATIVE 11/03/2017 0646   Sepsis Labs Invalid input(s): PROCALCITONIN,  WBC,  LACTICIDVEN Microbiology Recent Results (from the past 240 hour(s))  Respiratory Panel by RT PCR (Flu A&B, Covid) - Nasopharyngeal Swab     Status: None   Collection Time: 01/05/20 12:49 PM   Specimen: Nasopharyngeal Swab  Result Value Ref Range Status   SARS Coronavirus 2 by RT PCR NEGATIVE NEGATIVE Final    Comment: (NOTE) SARS-CoV-2 target nucleic acids are NOT DETECTED. The SARS-CoV-2 RNA  is generally detectable in upper respiratoy specimens during the acute phase of infection. The lowest concentration of SARS-CoV-2 viral copies this assay can detect is 131 copies/mL. A negative result does not preclude SARS-Cov-2 infection and should not be used as the sole basis for treatment or other patient management decisions. A negative result may occur with  improper specimen collection/handling, submission of specimen other than nasopharyngeal swab, presence of viral mutation(s) within the areas targeted by this assay, and inadequate number of viral copies (<131 copies/mL). A negative result must be combined with clinical observations, patient history, and epidemiological information. The expected result is Negative. Fact Sheet for Patients:  https://www.moore.com/ Fact Sheet for Healthcare Providers:  https://www.young.biz/ This test is  not yet ap proved or cleared by the [place] FDA and  has been authorized for detection and/or diagnosis of SARS-CoV-2 by FDA under an Emergency Use Authorization (EUA). This EUA will remain  in effect (meaning this test can be used) for the duration of the COVID-19 declaration under Section 564(b)(1) of the Act, 21 U.S.C. section 360bbb-3(b)(1), unless the authorization is terminated or revoked sooner.    Influenza A by PCR NEGATIVE NEGATIVE Final   Influenza B by PCR NEGATIVE NEGATIVE Final    Comment: (NOTE) The Xpert Xpress SARS-CoV-2/FLU/RSV assay is intended as an aid in  the diagnosis of influenza from Nasopharyngeal swab specimens and  should not be used as a sole basis for treatment. Nasal washings and  aspirates are unacceptable for Xpert Xpress SARS-CoV-2/FLU/RSV  testing. Fact Sheet for Patients: https://www.moore.com/ Fact Sheet for Healthcare Providers: https://www.young.biz/ This test is not yet approved or cleared by the [place] FDA and  has been authorized for detection and/or diagnosis of SARS-CoV-2 by  FDA under an Emergency Use Authorization (EUA). This EUA will remain  in effect (meaning this test can be used) for the duration of the  Covid-19 declaration under Section 564(b)(1) of the Act, 21  U.S.C. section 360bbb-3(b)(1), unless the authorization is  terminated or revoked. Performed at [facility], [address] [place], [place] [address]   SARS CORONAVIRUS 2 (TAT 6-24 HRS) Nasopharyngeal Nasopharyngeal Swab     Status: None   Collection Time: 01/06/20  2:46 PM   Specimen: Nasopharyngeal Swab  Result Value Ref Range Status   SARS Coronavirus 2 NEGATIVE NEGATIVE Final    Comment: (NOTE) SARS-CoV-2 target nucleic acids are NOT DETECTED. The SARS-CoV-2 RNA is generally detectable in upper and lower respiratory specimens during the acute phase of infection. Negative results do not preclude SARS-CoV-2  infection, do not rule out co-infections with other pathogens, and should not be used as the sole basis for treatment or other patient management decisions. Negative results must be combined with clinical observations, patient history, and epidemiological information. The expected result is Negative. Fact Sheet for Patients: HairSlick.no Fact Sheet for Healthcare Providers: quierodirigir.com This test is not yet approved or cleared by the [place] FDA and  has been authorized for detection and/or diagnosis of SARS-CoV-2 by FDA under an Emergency Use Authorization (EUA). This EUA will remain  in effect (meaning this test can be used) for the duration of the COVID-19 declaration under Section 56 4(b)(1) of the Act, 21 U.S.C. section 360bbb-3(b)(1), unless the authorization is terminated or revoked sooner. Performed at [facility], [address] [place], [place] [address]     Please note: You were cared for by a hospitalist during your hospital stay. Once you are discharged, your primary care  physician will handle any further medical issues. Please note that NO REFILLS for any discharge medications will be authorized once you are discharged, as it is imperative that you return to your primary care physician (or establish a relationship with a primary care physician if you do not have one) for your post hospital discharge needs so that they can reassess your need for medications and monitor your lab values.    Time coordinating discharge: 40 minutes  SIGNED:   Burnadette Pop, MD  [facility] 01/08/2020, 9:54 AM Pager [phone]  If 7PM-7AM, please contact night-coverage [email or web address] Password [email or web address]

## 2020-01-08 NOTE — Progress Notes (Signed)
Physical Therapy Treatment Patient Details Name: Kim Adams MRN: [id number] DOB: [birth year] Today's Date: 01/08/2020    History of Present Illness  Kim Adams is a 79 y.o. female with medical history significant of IIDM, HTN, HLD presented with left pain after a mechanical fall, resutling in L femur fx, now s/p IM Nail, WBAT    PT Comments    Continuing work on functional mobility and activity tolerance;  Able to incr amb distance today, and she seemed please to get up on the early side and walk in her room; Overall progressing well; Anticipate continuing good progress at post-acute rehabilitation.    Follow Up Recommendations  SNF;Other (comment)(wants to go to SNF at [facility])     Engineer, agricultural with 5" wheels;3in1 (PT)    Recommendations for Other Services       Precautions / Restrictions Precautions Precautions: Fall Restrictions LLE Weight Bearing: Weight bearing as tolerated    Mobility  Bed Mobility Overal bed mobility: Needs Assistance Bed Mobility: Supine to Sit     Supine to sit: Mod assist     General bed mobility comments: Cues for technique; Noted very nice half-bridge to EOB on her uninvolved R side; mod assist to elevate trunk to sit, and use of bed pad to square off hips at EOB  Transfers Overall transfer level: Needs assistance Equipment used: Rolling walker (2 wheeled) Transfers: Sit to/from Stand Sit to Stand: Min assist         General transfer comment: Min assist to steady, especially during transition of hands to RW, standing from bed and 3in1  Ambulation/Gait Ambulation/Gait assistance: Min guard Gait Distance (Feet): 50 Feet Assistive device: Rolling walker (2 wheeled) Gait Pattern/deviations: Step-through pattern(emerging step-through)     General Gait Details: Antalgic gait, good use of RW to take weight off LLE in stance   Stairs             Wheelchair Mobility    Modified Rankin  (Stroke Patients Only)       Balance     Sitting balance-Leahy Scale: Fair       Standing balance-Leahy Scale: Poor Standing balance comment: reliant on UE support                            Cognition Arousal/Alertness: Awake/alert Behavior During Therapy: WFL for tasks assessed/performed Overall Cognitive Status: No family/caregiver present to determine baseline cognitive functioning Area of Impairment: Memory;Problem solving                     Memory: Decreased short-term memory       Problem Solving: Requires tactile cues;Slow processing        Exercises      General Comments        Pertinent Vitals/Pain Pain Assessment: Faces Faces Pain Scale: Hurts little more Pain Location: L hip, especially with weight bearing Pain Descriptors / Indicators: Aching;Grimacing Pain Intervention(s): Monitored during session    Home Living                      Prior Function            PT Goals (current goals can now be found in the care plan section) Acute Rehab PT Goals Patient Stated Goal: to get therapy  PT Goal Formulation: With patient Time For Goal Achievement: 01/20/20 Potential to Achieve Goals: Good Progress towards PT goals: Progressing  toward goals    Frequency    Min 3X/week      PT Plan Current plan remains appropriate    Co-evaluation              AM-PAC PT "6 Clicks" Mobility   Outcome Measure  Help needed turning from your back to your side while in a flat bed without using bedrails?: A Lot Help needed moving from lying on your back to sitting on the side of a flat bed without using bedrails?: A Little Help needed moving to and from a bed to a chair (including a wheelchair)?: A Little Help needed standing up from a chair using your arms (e.g., wheelchair or bedside chair)?: A Little Help needed to walk in hospital room?: A Little Help needed climbing 3-5 steps with a railing? : A Lot 6 Click Score:  16    End of Session Equipment Utilized During Treatment: Gait belt Activity Tolerance: Patient tolerated treatment well Patient left: in chair;with call bell/phone within reach;with chair alarm set Nurse Communication: Mobility status PT Visit Diagnosis: Unsteadiness on feet (R26.81);Other abnormalities of gait and mobility (R26.89)     Time: 3810-1751 PT Time Calculation (min) (ACUTE ONLY): 21 min  Charges:  $Gait Training: 8-22 mins                     Roney Marion, [place]  Acute Rehabilitation Services Pager [phone] Office [place] 01/08/2020, 10:20 AM

## 2020-01-08 NOTE — Plan of Care (Signed)
Pt discharging to SNF for continue therapy and care. Plan of care will continue there.

## 2020-01-08 NOTE — TOC Transition Note (Signed)
Transition of Care [facility]) - CM/SW Discharge Note   Patient Details  Name: Kim Adams MRN: [id number] Date of Birth: Jun 12, 1941  Transition of Care [facility]) CM/SW Contact:  Epifanio Lesches, RN Phone Number: 01/08/2020, 10:01 AM   Clinical Narrative:    Patient will DC to: [facility] Anticipated DC date: 01/08/2020 Family notified: Harriett Sine Transport by: Sharin Mons   Per MD patient ready for DC to SNF/ [facility] . RN, patient, patient's family, and facility notified of DC. Discharge Summary and FL2 sent to facility. RN to call report prior to discharge [phone]). RM # 403.DC packet on chart. Ambulance transport requested for patient.   RNCM will sign off for now as intervention is no longer needed. Please consult [place] again if new needs arise.    Final next level of care: Skilled Nursing Facility Barriers to Discharge: No Barriers Identified   Patient Goals and CMS Choice Patient states their goals for this hospitalization and ongoing recovery are:: To be able to retun to [facility]      Discharge Placement              Patient chooses bed at: [name] Patient to be transferred to facility by: [facility] Name of family member notified: Harriett Sine ( sister) Patient and family notified of of transfer: 01/08/20  Discharge Plan and Services                                     Social Determinants of Health (SDOH) Interventions     Readmission Risk Interventions No flowsheet data found.

## 2020-01-08 NOTE — Progress Notes (Signed)
Pt discharging to SNF,  [name]. Copy of instructions printed and sent with transporters for facility.  Pt d/c'd with belongings, transported by [facility].     Will call report to facility at this time. At 1147 Message left at number [phone], nursing supervisior per recording on voice mail.  Await return call to give report to facility staff member.

## 2020-01-08 NOTE — Progress Notes (Addendum)
NCM has reached out to [facility] admissions to determine today's readiness for  receiving pt. Voice message left ...awaiting call back. Hoping to d/c today. TOC team will continue to monitor. Gae Gallop RN,BSN,CM [phone]

## 2020-01-08 NOTE — TOC Progression Note (Signed)
Transition of Care [facility]) - Progression Note    Patient Details  Name: Kim Adams MRN: [id number] Date of Birth: 1941-07-25  Transition of Care [facility]) CM/SW Contact  Mearl Latin, LCSW Phone Number: 01/08/2020, 8:45 AM  Clinical Narrative:    Insurance approval received for [employer]: [id number] for 3 days.    Expected Discharge Plan: Skilled Nursing Facility Barriers to Discharge: No Barriers Identified  Expected Discharge Plan and Services Expected Discharge Plan: Skilled Nursing Facility                                               Social Determinants of Health (SDOH) Interventions    Readmission Risk Interventions No flowsheet data found.

## 2020-01-09 DIAGNOSIS — R609 Edema, unspecified: Secondary | ICD-10-CM | POA: Diagnosis not present

## 2020-01-09 DIAGNOSIS — I10 Essential (primary) hypertension: Secondary | ICD-10-CM | POA: Diagnosis not present

## 2020-01-09 DIAGNOSIS — E119 Type 2 diabetes mellitus without complications: Secondary | ICD-10-CM | POA: Diagnosis not present

## 2020-01-09 DIAGNOSIS — S7292XA Unspecified fracture of left femur, initial encounter for closed fracture: Secondary | ICD-10-CM | POA: Diagnosis not present

## 2020-01-11 DIAGNOSIS — M6281 Muscle weakness (generalized): Secondary | ICD-10-CM | POA: Diagnosis not present

## 2020-01-11 DIAGNOSIS — I10 Essential (primary) hypertension: Secondary | ICD-10-CM | POA: Diagnosis not present

## 2020-01-11 DIAGNOSIS — S7292XA Unspecified fracture of left femur, initial encounter for closed fracture: Secondary | ICD-10-CM | POA: Diagnosis not present

## 2020-01-11 DIAGNOSIS — K5900 Constipation, unspecified: Secondary | ICD-10-CM | POA: Diagnosis not present

## 2020-01-14 DIAGNOSIS — J029 Acute pharyngitis, unspecified: Secondary | ICD-10-CM | POA: Diagnosis not present

## 2020-01-14 DIAGNOSIS — I10 Essential (primary) hypertension: Secondary | ICD-10-CM | POA: Diagnosis not present

## 2020-01-14 DIAGNOSIS — M6281 Muscle weakness (generalized): Secondary | ICD-10-CM | POA: Diagnosis not present

## 2020-01-14 DIAGNOSIS — S7292XA Unspecified fracture of left femur, initial encounter for closed fracture: Secondary | ICD-10-CM | POA: Diagnosis not present

## 2020-01-15 DIAGNOSIS — M6281 Muscle weakness (generalized): Secondary | ICD-10-CM | POA: Diagnosis not present

## 2020-01-15 DIAGNOSIS — M80052D Age-related osteoporosis with current pathological fracture, left femur, subsequent encounter for fracture with routine healing: Secondary | ICD-10-CM | POA: Diagnosis not present

## 2020-01-15 DIAGNOSIS — E118 Type 2 diabetes mellitus with unspecified complications: Secondary | ICD-10-CM | POA: Diagnosis not present

## 2020-01-15 DIAGNOSIS — Z7409 Other reduced mobility: Secondary | ICD-10-CM | POA: Diagnosis not present

## 2020-01-16 DIAGNOSIS — M6281 Muscle weakness (generalized): Secondary | ICD-10-CM | POA: Diagnosis not present

## 2020-01-16 DIAGNOSIS — G8911 Acute pain due to trauma: Secondary | ICD-10-CM | POA: Diagnosis not present

## 2020-01-16 DIAGNOSIS — M80052D Age-related osteoporosis with current pathological fracture, left femur, subsequent encounter for fracture with routine healing: Secondary | ICD-10-CM | POA: Diagnosis not present

## 2020-01-16 DIAGNOSIS — Z7409 Other reduced mobility: Secondary | ICD-10-CM | POA: Diagnosis not present

## 2020-01-18 DIAGNOSIS — Z7409 Other reduced mobility: Secondary | ICD-10-CM | POA: Diagnosis not present

## 2020-01-18 DIAGNOSIS — K5900 Constipation, unspecified: Secondary | ICD-10-CM | POA: Diagnosis not present

## 2020-01-18 DIAGNOSIS — D649 Anemia, unspecified: Secondary | ICD-10-CM | POA: Diagnosis not present

## 2020-01-18 DIAGNOSIS — M6281 Muscle weakness (generalized): Secondary | ICD-10-CM | POA: Diagnosis not present

## 2020-01-21 DIAGNOSIS — S7292XA Unspecified fracture of left femur, initial encounter for closed fracture: Secondary | ICD-10-CM | POA: Diagnosis not present

## 2020-01-21 DIAGNOSIS — Z7409 Other reduced mobility: Secondary | ICD-10-CM | POA: Diagnosis not present

## 2020-01-21 DIAGNOSIS — M6281 Muscle weakness (generalized): Secondary | ICD-10-CM | POA: Diagnosis not present

## 2020-01-21 DIAGNOSIS — S7292XD Unspecified fracture of left femur, subsequent encounter for closed fracture with routine healing: Secondary | ICD-10-CM | POA: Diagnosis not present

## 2020-01-21 DIAGNOSIS — R2689 Other abnormalities of gait and mobility: Secondary | ICD-10-CM | POA: Diagnosis not present

## 2020-01-21 DIAGNOSIS — K5900 Constipation, unspecified: Secondary | ICD-10-CM | POA: Diagnosis not present

## 2020-01-22 DIAGNOSIS — R2689 Other abnormalities of gait and mobility: Secondary | ICD-10-CM | POA: Diagnosis not present

## 2020-01-22 DIAGNOSIS — M6281 Muscle weakness (generalized): Secondary | ICD-10-CM | POA: Diagnosis not present

## 2020-01-22 DIAGNOSIS — S7292XD Unspecified fracture of left femur, subsequent encounter for closed fracture with routine healing: Secondary | ICD-10-CM | POA: Diagnosis not present

## 2020-01-23 DIAGNOSIS — R2689 Other abnormalities of gait and mobility: Secondary | ICD-10-CM | POA: Diagnosis not present

## 2020-01-23 DIAGNOSIS — M6281 Muscle weakness (generalized): Secondary | ICD-10-CM | POA: Diagnosis not present

## 2020-01-23 DIAGNOSIS — S7292XD Unspecified fracture of left femur, subsequent encounter for closed fracture with routine healing: Secondary | ICD-10-CM | POA: Diagnosis not present

## 2020-01-24 DIAGNOSIS — S7292XD Unspecified fracture of left femur, subsequent encounter for closed fracture with routine healing: Secondary | ICD-10-CM | POA: Diagnosis not present

## 2020-01-24 DIAGNOSIS — R2689 Other abnormalities of gait and mobility: Secondary | ICD-10-CM | POA: Diagnosis not present

## 2020-01-24 DIAGNOSIS — M6281 Muscle weakness (generalized): Secondary | ICD-10-CM | POA: Diagnosis not present

## 2020-01-25 DIAGNOSIS — M6281 Muscle weakness (generalized): Secondary | ICD-10-CM | POA: Diagnosis not present

## 2020-01-25 DIAGNOSIS — S7292XD Unspecified fracture of left femur, subsequent encounter for closed fracture with routine healing: Secondary | ICD-10-CM | POA: Diagnosis not present

## 2020-01-25 DIAGNOSIS — R2689 Other abnormalities of gait and mobility: Secondary | ICD-10-CM | POA: Diagnosis not present

## 2020-01-28 DIAGNOSIS — S7292XD Unspecified fracture of left femur, subsequent encounter for closed fracture with routine healing: Secondary | ICD-10-CM | POA: Diagnosis not present

## 2020-01-28 DIAGNOSIS — M6281 Muscle weakness (generalized): Secondary | ICD-10-CM | POA: Diagnosis not present

## 2020-01-28 DIAGNOSIS — R2689 Other abnormalities of gait and mobility: Secondary | ICD-10-CM | POA: Diagnosis not present

## 2020-01-29 DIAGNOSIS — S7292XD Unspecified fracture of left femur, subsequent encounter for closed fracture with routine healing: Secondary | ICD-10-CM | POA: Diagnosis not present

## 2020-01-29 DIAGNOSIS — R2689 Other abnormalities of gait and mobility: Secondary | ICD-10-CM | POA: Diagnosis not present

## 2020-01-29 DIAGNOSIS — M6281 Muscle weakness (generalized): Secondary | ICD-10-CM | POA: Diagnosis not present

## 2020-01-30 DIAGNOSIS — S7292XD Unspecified fracture of left femur, subsequent encounter for closed fracture with routine healing: Secondary | ICD-10-CM | POA: Diagnosis not present

## 2020-01-30 DIAGNOSIS — R2689 Other abnormalities of gait and mobility: Secondary | ICD-10-CM | POA: Diagnosis not present

## 2020-01-30 DIAGNOSIS — M6281 Muscle weakness (generalized): Secondary | ICD-10-CM | POA: Diagnosis not present

## 2020-01-31 DIAGNOSIS — Z23 Encounter for immunization: Secondary | ICD-10-CM | POA: Diagnosis not present

## 2020-01-31 DIAGNOSIS — R2689 Other abnormalities of gait and mobility: Secondary | ICD-10-CM | POA: Diagnosis not present

## 2020-01-31 DIAGNOSIS — Z79899 Other long term (current) drug therapy: Secondary | ICD-10-CM | POA: Diagnosis not present

## 2020-01-31 DIAGNOSIS — E1169 Type 2 diabetes mellitus with other specified complication: Secondary | ICD-10-CM | POA: Diagnosis not present

## 2020-01-31 DIAGNOSIS — S7292XD Unspecified fracture of left femur, subsequent encounter for closed fracture with routine healing: Secondary | ICD-10-CM | POA: Diagnosis not present

## 2020-01-31 DIAGNOSIS — I10 Essential (primary) hypertension: Secondary | ICD-10-CM | POA: Diagnosis not present

## 2020-01-31 DIAGNOSIS — M6281 Muscle weakness (generalized): Secondary | ICD-10-CM | POA: Diagnosis not present

## 2020-01-31 DIAGNOSIS — E7800 Pure hypercholesterolemia, unspecified: Secondary | ICD-10-CM | POA: Diagnosis not present

## 2020-02-01 DIAGNOSIS — R2689 Other abnormalities of gait and mobility: Secondary | ICD-10-CM | POA: Diagnosis not present

## 2020-02-01 DIAGNOSIS — M6281 Muscle weakness (generalized): Secondary | ICD-10-CM | POA: Diagnosis not present

## 2020-02-01 DIAGNOSIS — S7292XD Unspecified fracture of left femur, subsequent encounter for closed fracture with routine healing: Secondary | ICD-10-CM | POA: Diagnosis not present

## 2020-02-01 DIAGNOSIS — Z4789 Encounter for other orthopedic aftercare: Secondary | ICD-10-CM | POA: Diagnosis not present

## 2020-02-04 DIAGNOSIS — S7292XD Unspecified fracture of left femur, subsequent encounter for closed fracture with routine healing: Secondary | ICD-10-CM | POA: Diagnosis not present

## 2020-02-04 DIAGNOSIS — M6281 Muscle weakness (generalized): Secondary | ICD-10-CM | POA: Diagnosis not present

## 2020-02-04 DIAGNOSIS — R2689 Other abnormalities of gait and mobility: Secondary | ICD-10-CM | POA: Diagnosis not present

## 2020-02-05 DIAGNOSIS — M6281 Muscle weakness (generalized): Secondary | ICD-10-CM | POA: Diagnosis not present

## 2020-02-05 DIAGNOSIS — S7292XD Unspecified fracture of left femur, subsequent encounter for closed fracture with routine healing: Secondary | ICD-10-CM | POA: Diagnosis not present

## 2020-02-05 DIAGNOSIS — R2689 Other abnormalities of gait and mobility: Secondary | ICD-10-CM | POA: Diagnosis not present

## 2020-02-06 DIAGNOSIS — S7292XD Unspecified fracture of left femur, subsequent encounter for closed fracture with routine healing: Secondary | ICD-10-CM | POA: Diagnosis not present

## 2020-02-06 DIAGNOSIS — R2689 Other abnormalities of gait and mobility: Secondary | ICD-10-CM | POA: Diagnosis not present

## 2020-02-06 DIAGNOSIS — Z79899 Other long term (current) drug therapy: Secondary | ICD-10-CM | POA: Diagnosis not present

## 2020-02-06 DIAGNOSIS — M6281 Muscle weakness (generalized): Secondary | ICD-10-CM | POA: Diagnosis not present

## 2020-02-07 DIAGNOSIS — M6281 Muscle weakness (generalized): Secondary | ICD-10-CM | POA: Diagnosis not present

## 2020-02-07 DIAGNOSIS — R2689 Other abnormalities of gait and mobility: Secondary | ICD-10-CM | POA: Diagnosis not present

## 2020-02-07 DIAGNOSIS — S7292XD Unspecified fracture of left femur, subsequent encounter for closed fracture with routine healing: Secondary | ICD-10-CM | POA: Diagnosis not present

## 2020-02-08 DIAGNOSIS — R2689 Other abnormalities of gait and mobility: Secondary | ICD-10-CM | POA: Diagnosis not present

## 2020-02-08 DIAGNOSIS — S7292XD Unspecified fracture of left femur, subsequent encounter for closed fracture with routine healing: Secondary | ICD-10-CM | POA: Diagnosis not present

## 2020-02-08 DIAGNOSIS — M6281 Muscle weakness (generalized): Secondary | ICD-10-CM | POA: Diagnosis not present

## 2020-02-11 DIAGNOSIS — R2689 Other abnormalities of gait and mobility: Secondary | ICD-10-CM | POA: Diagnosis not present

## 2020-02-11 DIAGNOSIS — M6281 Muscle weakness (generalized): Secondary | ICD-10-CM | POA: Diagnosis not present

## 2020-02-11 DIAGNOSIS — S7292XD Unspecified fracture of left femur, subsequent encounter for closed fracture with routine healing: Secondary | ICD-10-CM | POA: Diagnosis not present

## 2020-02-12 DIAGNOSIS — M6281 Muscle weakness (generalized): Secondary | ICD-10-CM | POA: Diagnosis not present

## 2020-02-12 DIAGNOSIS — R2689 Other abnormalities of gait and mobility: Secondary | ICD-10-CM | POA: Diagnosis not present

## 2020-02-12 DIAGNOSIS — S7292XD Unspecified fracture of left femur, subsequent encounter for closed fracture with routine healing: Secondary | ICD-10-CM | POA: Diagnosis not present

## 2020-02-13 DIAGNOSIS — M6281 Muscle weakness (generalized): Secondary | ICD-10-CM | POA: Diagnosis not present

## 2020-02-13 DIAGNOSIS — R2689 Other abnormalities of gait and mobility: Secondary | ICD-10-CM | POA: Diagnosis not present

## 2020-02-13 DIAGNOSIS — S7292XD Unspecified fracture of left femur, subsequent encounter for closed fracture with routine healing: Secondary | ICD-10-CM | POA: Diagnosis not present

## 2020-02-14 DIAGNOSIS — M6281 Muscle weakness (generalized): Secondary | ICD-10-CM | POA: Diagnosis not present

## 2020-02-14 DIAGNOSIS — R2689 Other abnormalities of gait and mobility: Secondary | ICD-10-CM | POA: Diagnosis not present

## 2020-02-14 DIAGNOSIS — S7292XD Unspecified fracture of left femur, subsequent encounter for closed fracture with routine healing: Secondary | ICD-10-CM | POA: Diagnosis not present

## 2020-02-15 DIAGNOSIS — R2689 Other abnormalities of gait and mobility: Secondary | ICD-10-CM | POA: Diagnosis not present

## 2020-02-15 DIAGNOSIS — M6281 Muscle weakness (generalized): Secondary | ICD-10-CM | POA: Diagnosis not present

## 2020-02-15 DIAGNOSIS — S7292XD Unspecified fracture of left femur, subsequent encounter for closed fracture with routine healing: Secondary | ICD-10-CM | POA: Diagnosis not present

## 2020-02-18 DIAGNOSIS — R2689 Other abnormalities of gait and mobility: Secondary | ICD-10-CM | POA: Diagnosis not present

## 2020-02-18 DIAGNOSIS — M6281 Muscle weakness (generalized): Secondary | ICD-10-CM | POA: Diagnosis not present

## 2020-02-18 DIAGNOSIS — S7292XD Unspecified fracture of left femur, subsequent encounter for closed fracture with routine healing: Secondary | ICD-10-CM | POA: Diagnosis not present

## 2020-02-19 DIAGNOSIS — M6281 Muscle weakness (generalized): Secondary | ICD-10-CM | POA: Diagnosis not present

## 2020-02-19 DIAGNOSIS — R2689 Other abnormalities of gait and mobility: Secondary | ICD-10-CM | POA: Diagnosis not present

## 2020-02-19 DIAGNOSIS — S7292XD Unspecified fracture of left femur, subsequent encounter for closed fracture with routine healing: Secondary | ICD-10-CM | POA: Diagnosis not present

## 2020-02-20 DIAGNOSIS — R2689 Other abnormalities of gait and mobility: Secondary | ICD-10-CM | POA: Diagnosis not present

## 2020-02-20 DIAGNOSIS — M6281 Muscle weakness (generalized): Secondary | ICD-10-CM | POA: Diagnosis not present

## 2020-02-20 DIAGNOSIS — S7292XD Unspecified fracture of left femur, subsequent encounter for closed fracture with routine healing: Secondary | ICD-10-CM | POA: Diagnosis not present

## 2020-02-21 DIAGNOSIS — Z4789 Encounter for other orthopedic aftercare: Secondary | ICD-10-CM | POA: Diagnosis not present

## 2020-02-22 DIAGNOSIS — R2689 Other abnormalities of gait and mobility: Secondary | ICD-10-CM | POA: Diagnosis not present

## 2020-02-22 DIAGNOSIS — M6281 Muscle weakness (generalized): Secondary | ICD-10-CM | POA: Diagnosis not present

## 2020-02-22 DIAGNOSIS — S7292XD Unspecified fracture of left femur, subsequent encounter for closed fracture with routine healing: Secondary | ICD-10-CM | POA: Diagnosis not present

## 2020-02-25 DIAGNOSIS — M6281 Muscle weakness (generalized): Secondary | ICD-10-CM | POA: Diagnosis not present

## 2020-02-25 DIAGNOSIS — R2689 Other abnormalities of gait and mobility: Secondary | ICD-10-CM | POA: Diagnosis not present

## 2020-02-25 DIAGNOSIS — S7292XD Unspecified fracture of left femur, subsequent encounter for closed fracture with routine healing: Secondary | ICD-10-CM | POA: Diagnosis not present

## 2020-02-26 DIAGNOSIS — M6281 Muscle weakness (generalized): Secondary | ICD-10-CM | POA: Diagnosis not present

## 2020-02-26 DIAGNOSIS — S7292XD Unspecified fracture of left femur, subsequent encounter for closed fracture with routine healing: Secondary | ICD-10-CM | POA: Diagnosis not present

## 2020-02-26 DIAGNOSIS — R2689 Other abnormalities of gait and mobility: Secondary | ICD-10-CM | POA: Diagnosis not present

## 2020-02-28 DIAGNOSIS — M6281 Muscle weakness (generalized): Secondary | ICD-10-CM | POA: Diagnosis not present

## 2020-02-28 DIAGNOSIS — S7292XD Unspecified fracture of left femur, subsequent encounter for closed fracture with routine healing: Secondary | ICD-10-CM | POA: Diagnosis not present

## 2020-02-28 DIAGNOSIS — R2689 Other abnormalities of gait and mobility: Secondary | ICD-10-CM | POA: Diagnosis not present

## 2020-03-03 DIAGNOSIS — R2689 Other abnormalities of gait and mobility: Secondary | ICD-10-CM | POA: Diagnosis not present

## 2020-03-03 DIAGNOSIS — S7292XD Unspecified fracture of left femur, subsequent encounter for closed fracture with routine healing: Secondary | ICD-10-CM | POA: Diagnosis not present

## 2020-03-03 DIAGNOSIS — M6281 Muscle weakness (generalized): Secondary | ICD-10-CM | POA: Diagnosis not present

## 2020-03-04 DIAGNOSIS — R2689 Other abnormalities of gait and mobility: Secondary | ICD-10-CM | POA: Diagnosis not present

## 2020-03-04 DIAGNOSIS — M6281 Muscle weakness (generalized): Secondary | ICD-10-CM | POA: Diagnosis not present

## 2020-03-04 DIAGNOSIS — S7292XD Unspecified fracture of left femur, subsequent encounter for closed fracture with routine healing: Secondary | ICD-10-CM | POA: Diagnosis not present

## 2020-03-06 DIAGNOSIS — M6281 Muscle weakness (generalized): Secondary | ICD-10-CM | POA: Diagnosis not present

## 2020-03-06 DIAGNOSIS — S7292XD Unspecified fracture of left femur, subsequent encounter for closed fracture with routine healing: Secondary | ICD-10-CM | POA: Diagnosis not present

## 2020-03-06 DIAGNOSIS — R2689 Other abnormalities of gait and mobility: Secondary | ICD-10-CM | POA: Diagnosis not present

## 2020-03-10 DIAGNOSIS — M6281 Muscle weakness (generalized): Secondary | ICD-10-CM | POA: Diagnosis not present

## 2020-03-10 DIAGNOSIS — R2689 Other abnormalities of gait and mobility: Secondary | ICD-10-CM | POA: Diagnosis not present

## 2020-03-10 DIAGNOSIS — S7292XD Unspecified fracture of left femur, subsequent encounter for closed fracture with routine healing: Secondary | ICD-10-CM | POA: Diagnosis not present

## 2020-03-11 DIAGNOSIS — S7292XD Unspecified fracture of left femur, subsequent encounter for closed fracture with routine healing: Secondary | ICD-10-CM | POA: Diagnosis not present

## 2020-03-11 DIAGNOSIS — M6281 Muscle weakness (generalized): Secondary | ICD-10-CM | POA: Diagnosis not present

## 2020-03-11 DIAGNOSIS — R2689 Other abnormalities of gait and mobility: Secondary | ICD-10-CM | POA: Diagnosis not present

## 2020-03-13 DIAGNOSIS — S7292XD Unspecified fracture of left femur, subsequent encounter for closed fracture with routine healing: Secondary | ICD-10-CM | POA: Diagnosis not present

## 2020-03-13 DIAGNOSIS — R2689 Other abnormalities of gait and mobility: Secondary | ICD-10-CM | POA: Diagnosis not present

## 2020-03-13 DIAGNOSIS — M6281 Muscle weakness (generalized): Secondary | ICD-10-CM | POA: Diagnosis not present

## 2020-03-17 DIAGNOSIS — R2689 Other abnormalities of gait and mobility: Secondary | ICD-10-CM | POA: Diagnosis not present

## 2020-03-17 DIAGNOSIS — M6281 Muscle weakness (generalized): Secondary | ICD-10-CM | POA: Diagnosis not present

## 2020-03-17 DIAGNOSIS — S7292XD Unspecified fracture of left femur, subsequent encounter for closed fracture with routine healing: Secondary | ICD-10-CM | POA: Diagnosis not present

## 2020-03-18 DIAGNOSIS — S7292XD Unspecified fracture of left femur, subsequent encounter for closed fracture with routine healing: Secondary | ICD-10-CM | POA: Diagnosis not present

## 2020-03-18 DIAGNOSIS — M6281 Muscle weakness (generalized): Secondary | ICD-10-CM | POA: Diagnosis not present

## 2020-03-18 DIAGNOSIS — R2689 Other abnormalities of gait and mobility: Secondary | ICD-10-CM | POA: Diagnosis not present

## 2020-03-19 DIAGNOSIS — R634 Abnormal weight loss: Secondary | ICD-10-CM | POA: Diagnosis not present

## 2020-03-19 DIAGNOSIS — E1169 Type 2 diabetes mellitus with other specified complication: Secondary | ICD-10-CM | POA: Diagnosis not present

## 2020-03-19 DIAGNOSIS — I10 Essential (primary) hypertension: Secondary | ICD-10-CM | POA: Diagnosis not present

## 2020-03-19 DIAGNOSIS — E441 Mild protein-calorie malnutrition: Secondary | ICD-10-CM | POA: Diagnosis not present

## 2020-03-24 ENCOUNTER — Other Ambulatory Visit: Payer: Self-pay | Admitting: Geriatric Medicine

## 2020-03-24 DIAGNOSIS — Z1231 Encounter for screening mammogram for malignant neoplasm of breast: Secondary | ICD-10-CM

## 2020-04-21 DIAGNOSIS — H2513 Age-related nuclear cataract, bilateral: Secondary | ICD-10-CM | POA: Diagnosis not present

## 2020-04-21 DIAGNOSIS — E119 Type 2 diabetes mellitus without complications: Secondary | ICD-10-CM | POA: Diagnosis not present

## 2020-04-25 ENCOUNTER — Other Ambulatory Visit: Payer: Self-pay | Admitting: Obstetrics & Gynecology

## 2020-05-02 ENCOUNTER — Ambulatory Visit
Admission: RE | Admit: 2020-05-02 | Discharge: 2020-05-02 | Disposition: A | Discharge status: Home / Self Care | Payer: Medicare Other | Source: Ambulatory Visit | Attending: Geriatric Medicine | Admitting: Geriatric Medicine

## 2020-05-02 ENCOUNTER — Other Ambulatory Visit: Payer: Self-pay

## 2020-05-02 DIAGNOSIS — Z1231 Encounter for screening mammogram for malignant neoplasm of breast: Secondary | ICD-10-CM | POA: Diagnosis not present

## 2020-06-04 DIAGNOSIS — E1169 Type 2 diabetes mellitus with other specified complication: Secondary | ICD-10-CM | POA: Diagnosis not present

## 2020-06-04 DIAGNOSIS — E441 Mild protein-calorie malnutrition: Secondary | ICD-10-CM | POA: Diagnosis not present

## 2020-06-04 DIAGNOSIS — Z79899 Other long term (current) drug therapy: Secondary | ICD-10-CM | POA: Diagnosis not present

## 2020-06-04 DIAGNOSIS — I10 Essential (primary) hypertension: Secondary | ICD-10-CM | POA: Diagnosis not present

## 2020-07-18 DIAGNOSIS — N390 Urinary tract infection, site not specified: Secondary | ICD-10-CM | POA: Diagnosis not present

## 2020-10-15 DIAGNOSIS — I10 Essential (primary) hypertension: Secondary | ICD-10-CM | POA: Diagnosis not present

## 2020-10-15 DIAGNOSIS — Z0000 Encounter for general adult medical examination without abnormal findings: Secondary | ICD-10-CM | POA: Diagnosis not present

## 2020-10-15 DIAGNOSIS — F325 Major depressive disorder, single episode, in full remission: Secondary | ICD-10-CM | POA: Diagnosis not present

## 2020-10-15 DIAGNOSIS — E7800 Pure hypercholesterolemia, unspecified: Secondary | ICD-10-CM | POA: Diagnosis not present

## 2021-03-25 ENCOUNTER — Other Ambulatory Visit: Payer: Self-pay | Admitting: Geriatric Medicine

## 2021-03-25 DIAGNOSIS — Z1231 Encounter for screening mammogram for malignant neoplasm of breast: Secondary | ICD-10-CM

## 2021-04-16 DIAGNOSIS — Z79899 Other long term (current) drug therapy: Secondary | ICD-10-CM | POA: Diagnosis not present

## 2021-04-16 DIAGNOSIS — I10 Essential (primary) hypertension: Secondary | ICD-10-CM | POA: Diagnosis not present

## 2021-04-16 DIAGNOSIS — E1169 Type 2 diabetes mellitus with other specified complication: Secondary | ICD-10-CM | POA: Diagnosis not present

## 2021-04-27 DIAGNOSIS — H2513 Age-related nuclear cataract, bilateral: Secondary | ICD-10-CM | POA: Diagnosis not present

## 2021-04-27 DIAGNOSIS — E119 Type 2 diabetes mellitus without complications: Secondary | ICD-10-CM | POA: Diagnosis not present

## 2021-05-21 ENCOUNTER — Other Ambulatory Visit: Payer: Self-pay

## 2021-05-21 ENCOUNTER — Ambulatory Visit
Admission: RE | Admit: 2021-05-21 | Discharge: 2021-05-21 | Disposition: A | Discharge status: Home / Self Care | Payer: Medicare Other | Source: Ambulatory Visit | Attending: Geriatric Medicine | Admitting: Geriatric Medicine

## 2021-05-21 DIAGNOSIS — Z1231 Encounter for screening mammogram for malignant neoplasm of breast: Secondary | ICD-10-CM

## 2021-10-22 DIAGNOSIS — E1169 Type 2 diabetes mellitus with other specified complication: Secondary | ICD-10-CM | POA: Diagnosis not present

## 2021-10-22 DIAGNOSIS — I10 Essential (primary) hypertension: Secondary | ICD-10-CM | POA: Diagnosis not present

## 2021-10-22 DIAGNOSIS — Z0000 Encounter for general adult medical examination without abnormal findings: Secondary | ICD-10-CM | POA: Diagnosis not present

## 2021-10-22 DIAGNOSIS — M810 Age-related osteoporosis without current pathological fracture: Secondary | ICD-10-CM | POA: Diagnosis not present

## 2021-10-22 DIAGNOSIS — E7800 Pure hypercholesterolemia, unspecified: Secondary | ICD-10-CM | POA: Diagnosis not present

## 2021-11-30 DIAGNOSIS — E559 Vitamin D deficiency, unspecified: Secondary | ICD-10-CM | POA: Diagnosis not present

## 2021-11-30 DIAGNOSIS — E119 Type 2 diabetes mellitus without complications: Secondary | ICD-10-CM | POA: Diagnosis not present

## 2021-11-30 DIAGNOSIS — M8580 Other specified disorders of bone density and structure, unspecified site: Secondary | ICD-10-CM | POA: Diagnosis not present

## 2021-11-30 DIAGNOSIS — E860 Dehydration: Secondary | ICD-10-CM | POA: Diagnosis not present

## 2021-11-30 DIAGNOSIS — M48061 Spinal stenosis, lumbar region without neurogenic claudication: Secondary | ICD-10-CM | POA: Diagnosis not present

## 2021-11-30 DIAGNOSIS — I10 Essential (primary) hypertension: Secondary | ICD-10-CM | POA: Diagnosis not present

## 2021-12-02 DIAGNOSIS — M6281 Muscle weakness (generalized): Secondary | ICD-10-CM | POA: Diagnosis not present

## 2021-12-02 DIAGNOSIS — M8580 Other specified disorders of bone density and structure, unspecified site: Secondary | ICD-10-CM | POA: Diagnosis not present

## 2021-12-02 DIAGNOSIS — R2689 Other abnormalities of gait and mobility: Secondary | ICD-10-CM | POA: Diagnosis not present

## 2021-12-02 DIAGNOSIS — M549 Dorsalgia, unspecified: Secondary | ICD-10-CM | POA: Diagnosis not present

## 2022-01-08 DIAGNOSIS — H0012 Chalazion right lower eyelid: Secondary | ICD-10-CM | POA: Diagnosis not present

## 2022-04-14 ENCOUNTER — Other Ambulatory Visit: Payer: Self-pay | Admitting: Internal Medicine

## 2022-04-14 DIAGNOSIS — Z1231 Encounter for screening mammogram for malignant neoplasm of breast: Secondary | ICD-10-CM

## 2022-05-24 ENCOUNTER — Ambulatory Visit: Payer: Medicare Other

## 2022-05-24 DIAGNOSIS — H2513 Age-related nuclear cataract, bilateral: Secondary | ICD-10-CM | POA: Diagnosis not present

## 2022-05-24 DIAGNOSIS — H5203 Hypermetropia, bilateral: Secondary | ICD-10-CM | POA: Diagnosis not present

## 2022-05-24 DIAGNOSIS — E119 Type 2 diabetes mellitus without complications: Secondary | ICD-10-CM | POA: Diagnosis not present

## 2022-06-09 DIAGNOSIS — H524 Presbyopia: Secondary | ICD-10-CM | POA: Diagnosis not present

## 2022-06-10 ENCOUNTER — Ambulatory Visit
Admission: RE | Admit: 2022-06-10 | Discharge: 2022-06-10 | Disposition: A | Discharge status: Home / Self Care | Payer: Medicare Other | Source: Ambulatory Visit | Attending: Internal Medicine | Admitting: Internal Medicine

## 2022-06-10 DIAGNOSIS — Z1231 Encounter for screening mammogram for malignant neoplasm of breast: Secondary | ICD-10-CM

## 2022-07-27 DIAGNOSIS — H6120 Impacted cerumen, unspecified ear: Secondary | ICD-10-CM | POA: Diagnosis not present

## 2022-08-03 DIAGNOSIS — I10 Essential (primary) hypertension: Secondary | ICD-10-CM | POA: Diagnosis not present

## 2022-08-03 DIAGNOSIS — H6123 Impacted cerumen, bilateral: Secondary | ICD-10-CM | POA: Diagnosis not present

## 2022-08-10 DIAGNOSIS — H6121 Impacted cerumen, right ear: Secondary | ICD-10-CM | POA: Diagnosis not present

## 2022-08-10 DIAGNOSIS — I10 Essential (primary) hypertension: Secondary | ICD-10-CM | POA: Diagnosis not present

## 2022-08-10 DIAGNOSIS — E1169 Type 2 diabetes mellitus with other specified complication: Secondary | ICD-10-CM | POA: Diagnosis not present

## 2022-08-10 DIAGNOSIS — E782 Mixed hyperlipidemia: Secondary | ICD-10-CM | POA: Diagnosis not present

## 2022-08-19 DIAGNOSIS — E875 Hyperkalemia: Secondary | ICD-10-CM | POA: Diagnosis not present

## 2022-10-12 ENCOUNTER — Other Ambulatory Visit: Payer: Self-pay | Admitting: Internal Medicine

## 2022-10-12 ENCOUNTER — Ambulatory Visit: Payer: Medicare Other | Admitting: Internal Medicine

## 2022-10-12 ENCOUNTER — Encounter: Payer: Self-pay | Admitting: Internal Medicine

## 2022-10-12 VITALS — BP 124/72 | HR 97 | Temp 98.2°F | Resp 18 | Ht 68.0 in | Wt 123.4 lb

## 2022-10-12 DIAGNOSIS — N1832 Chronic kidney disease, stage 3b: Secondary | ICD-10-CM | POA: Diagnosis not present

## 2022-10-12 DIAGNOSIS — E0822 Diabetes mellitus due to underlying condition with diabetic chronic kidney disease: Secondary | ICD-10-CM

## 2022-10-12 DIAGNOSIS — I10 Essential (primary) hypertension: Secondary | ICD-10-CM | POA: Diagnosis not present

## 2022-10-12 DIAGNOSIS — E785 Hyperlipidemia, unspecified: Secondary | ICD-10-CM

## 2022-10-12 MED ORDER — ABRYSVO 120 MCG/0.5ML IM SOLR
0.5000 mL | Freq: Once | INTRAMUSCULAR | 0 refills | Status: AC
Start: 2022-10-12 — End: 2022-10-12

## 2022-10-12 NOTE — Assessment & Plan Note (Signed)
C/w current medications.

## 2022-10-12 NOTE — Assessment & Plan Note (Signed)
Her LDL is controlled.

## 2022-10-12 NOTE — Progress Notes (Signed)
   Established Patient Office Visit  Subjective   Patient ID: Kim Adams, female    DOB: 28-Jul-1941  Age: 81 y.o. MRN: [id number]  No chief complaint on file.   HPI 81 years old female with history of diabetes and CKD 3b, who has high potassium on last labs and is here for repeat labs. She does not check her sugar at home.   She has hypertension and staff fill her pill box and she take her medications.   She also has dyslipidemia and she take simvastatin.   She is mentally slow an walk with walker.     Review of Systems  Constitutional: Negative.   HENT: Negative.    Respiratory: Negative.    Cardiovascular: Negative.   Gastrointestinal: Negative.   Neurological: Negative.       Objective:     There were no vitals taken for this visit.   Physical Exam HENT:     Head: Normocephalic and atraumatic.  Eyes:     Extraocular Movements: Extraocular movements intact.     Pupils: Pupils are equal, round, and reactive to light.  Cardiovascular:     Rate and Rhythm: Normal rate and regular rhythm.     Heart sounds: Normal heart sounds.  Pulmonary:     Effort: Pulmonary effort is normal.     Breath sounds: Normal breath sounds.  Abdominal:     General: Bowel sounds are normal.     Palpations: Abdomen is soft.  Neurological:     General: No focal deficit present.     Mental Status: She is alert and oriented to person, place, and time.      No results found for any visits on 10/12/22.   The ASCVD Risk score (Arnett DK, et al., 2019) failed to calculate for the following reasons:   The 2019 ASCVD risk score is only valid for ages 32 to 53    Assessment & Plan:   Problem List Items Addressed This Visit       Cardiovascular and Mediastinum   Primary hypertension     Endocrine   Diabetes mellitus due to underlying condition with stage 3b chronic kidney disease, without long-term current use of insulin (HCC) - Primary   Relevant Orders   Hemoglobin A1c   CMP14  + Anion Gap     Other   Dyslipidemia    Return in about 3 months (around 01/11/2023).    Eloisa Northern, MD

## 2022-10-14 LAB — COMPREHENSIVE METABOLIC PANEL
ALT: 16 IU/L (ref 0–32)
AST: 23 IU/L (ref 0–40)
Albumin/Globulin Ratio: 2.1 (ref 1.2–2.2)
Albumin: 4.4 g/dL (ref 3.7–4.7)
Alkaline Phosphatase: 95 IU/L (ref 44–121)
BUN/Creatinine Ratio: 49 — ABNORMAL HIGH (ref 12–28)
BUN: 55 mg/dL — ABNORMAL HIGH (ref 8–27)
Bilirubin Total: 0.3 mg/dL (ref 0.0–1.2)
CO2: 19 mmol/L — ABNORMAL LOW (ref 20–29)
Calcium: 9.6 mg/dL (ref 8.7–10.3)
Chloride: 104 mmol/L (ref 96–106)
Creatinine, Ser: 1.13 mg/dL — ABNORMAL HIGH (ref 0.57–1.00)
Globulin, Total: 2.1 g/dL (ref 1.5–4.5)
Glucose: 127 mg/dL — ABNORMAL HIGH (ref 70–99)
Potassium: 5.4 mmol/L — ABNORMAL HIGH (ref 3.5–5.2)
Sodium: 138 mmol/L (ref 134–144)
Total Protein: 6.5 g/dL (ref 6.0–8.5)
eGFR: 49 mL/min/{1.73_m2} — ABNORMAL LOW (ref >59)

## 2022-10-14 LAB — HGB A1C W/O EAG: Hgb A1c MFr Bld: 6.9 % — ABNORMAL HIGH (ref 4.8–5.6)

## 2023-01-11 ENCOUNTER — Encounter: Payer: Self-pay | Admitting: Internal Medicine

## 2023-01-11 ENCOUNTER — Ambulatory Visit: Payer: Medicare Other | Admitting: Internal Medicine

## 2023-01-11 VITALS — BP 124/74 | HR 97 | Temp 98.1°F | Resp 18 | Ht 68.0 in | Wt 114.4 lb

## 2023-01-11 DIAGNOSIS — E0822 Diabetes mellitus due to underlying condition with diabetic chronic kidney disease: Secondary | ICD-10-CM | POA: Diagnosis not present

## 2023-01-11 DIAGNOSIS — I10 Essential (primary) hypertension: Secondary | ICD-10-CM

## 2023-01-11 DIAGNOSIS — E785 Hyperlipidemia, unspecified: Secondary | ICD-10-CM

## 2023-01-11 DIAGNOSIS — N1832 Chronic kidney disease, stage 3b: Secondary | ICD-10-CM

## 2023-01-11 NOTE — Assessment & Plan Note (Signed)
Will do HbA1c and kidney function today

## 2023-01-11 NOTE — Progress Notes (Addendum)
   Office Visit  Subjective   Patient ID: Kim Adams   DOB: 07-13-1941   Age: 82 y.o.   MRN: [id number]   Chief Complaint Chief Complaint  Patient presents with   Follow-up    3 month follow up     History of Present Illness 82 years old female with history of diabetes and CKD 3b, who has high potassium on last labs and is here for repeat labs. She does not check her sugar at home.    She has hypertension and staff fill her pill box and she take her medications.    She also has dyslipidemia and she take simvastatin.    She is mentally slow an walk with walker.   Past Medical History Past Medical History:  Diagnosis Date   Anxiety    Depression    Diabetes mellitus    Hypertension      Allergies Allergies  Allergen Reactions   Fosamax [Alendronate Sodium] Other (See Comments)    Esophageal problems     Review of Systems Review of Systems  Constitutional: Negative.   HENT: Negative.    Respiratory: Negative.    Cardiovascular: Negative.   Gastrointestinal: Negative.   Neurological: Negative.        Objective:    Vitals BP 124/74 (BP Location: Right Arm, Patient Position: Sitting, Cuff Size: Normal)   Pulse 97   Temp 98.1 F (36.7 C)   Resp 18   Ht 5\' 8"  (1.727 m)   Wt 114 lb 6 oz (51.9 kg)   SpO2 98%   BMI 17.39 kg/m    Physical Examination Physical Exam Constitutional:      Appearance: Normal appearance.  HENT:     Head: Normocephalic and atraumatic.  Cardiovascular:     Rate and Rhythm: Normal rate and regular rhythm.     Heart sounds: Normal heart sounds.  Pulmonary:     Effort: Pulmonary effort is normal.     Breath sounds: Normal breath sounds.  Abdominal:     General: Bowel sounds are normal.     Palpations: Abdomen is soft.  Neurological:     Mental Status: She is alert.     Comments: She is mentally slow and walk with walker        Assessment & Plan:   Diabetes mellitus due to underlying condition with stage 3b chronic kidney  disease, without long-term current use of insulin (HCC) Will do HbA1c and kidney function today  Dyslipidemia Will do lipid panel today.  Primary hypertension Controlled.    Return in about 3 months (around 04/13/2023).   Garwin Brothers, MD

## 2023-01-11 NOTE — Assessment & Plan Note (Signed)
Controlled.  

## 2023-01-11 NOTE — Assessment & Plan Note (Signed)
Will do lipid panel today.

## 2023-01-13 LAB — HEMOGLOBIN A1C
Est. average glucose Bld gHb Est-mCnc: 169 mg/dL
Hgb A1c MFr Bld: 7.5 % — ABNORMAL HIGH (ref 4.8–5.6)

## 2023-01-13 LAB — CMP14 + ANION GAP
ALT: 30 IU/L (ref 0–32)
AST: 34 IU/L (ref 0–40)
Albumin/Globulin Ratio: 2 (ref 1.2–2.2)
Albumin: 4.1 g/dL (ref 3.7–4.7)
Alkaline Phosphatase: 85 IU/L (ref 44–121)
Anion Gap: 16 mmol/L (ref 10.0–18.0)
BUN/Creatinine Ratio: 32 — ABNORMAL HIGH (ref 12–28)
BUN: 32 mg/dL — ABNORMAL HIGH (ref 8–27)
Bilirubin Total: 0.5 mg/dL (ref 0.0–1.2)
CO2: 23 mmol/L (ref 20–29)
Calcium: 9.9 mg/dL (ref 8.7–10.3)
Chloride: 100 mmol/L (ref 96–106)
Creatinine, Ser: 0.99 mg/dL (ref 0.57–1.00)
Globulin, Total: 2.1 g/dL (ref 1.5–4.5)
Glucose: 89 mg/dL (ref 70–99)
Potassium: 4.9 mmol/L (ref 3.5–5.2)
Sodium: 139 mmol/L (ref 134–144)
Total Protein: 6.2 g/dL (ref 6.0–8.5)
eGFR: 57 mL/min/{1.73_m2} — ABNORMAL LOW (ref >59)

## 2023-01-13 LAB — LIPID PANEL
Chol/HDL Ratio: 2.7 ratio (ref 0.0–4.4)
Cholesterol, Total: 175 mg/dL (ref 100–199)
HDL: 66 mg/dL (ref >39)
LDL Chol Calc (NIH): 90 mg/dL (ref 0–99)
Triglycerides: 109 mg/dL (ref 0–149)
VLDL Cholesterol Cal: 19 mg/dL (ref 5–40)

## 2023-01-13 LAB — MICROALBUMIN / CREATININE URINE RATIO
Creatinine, Urine: 56.1 mg/dL
Microalb/Creat Ratio: 44 mg/g creat — ABNORMAL HIGH (ref 0–29)
Microalbumin, Urine: 24.5 ug/mL

## 2023-01-14 NOTE — Progress Notes (Signed)
I have spoken with her POA about results and will continue the same medications

## 2023-04-19 ENCOUNTER — Encounter: Payer: Self-pay | Admitting: Internal Medicine

## 2023-04-19 ENCOUNTER — Ambulatory Visit: Payer: Medicare Other | Admitting: Internal Medicine

## 2023-04-19 VITALS — BP 118/60 | HR 80 | Temp 98.3°F | Resp 18 | Ht 68.0 in | Wt 113.5 lb

## 2023-04-19 DIAGNOSIS — F819 Developmental disorder of scholastic skills, unspecified: Secondary | ICD-10-CM

## 2023-04-19 DIAGNOSIS — I10 Essential (primary) hypertension: Secondary | ICD-10-CM | POA: Diagnosis not present

## 2023-04-19 DIAGNOSIS — E785 Hyperlipidemia, unspecified: Secondary | ICD-10-CM | POA: Diagnosis not present

## 2023-04-19 DIAGNOSIS — E1122 Type 2 diabetes mellitus with diabetic chronic kidney disease: Secondary | ICD-10-CM | POA: Diagnosis not present

## 2023-04-19 DIAGNOSIS — N183 Chronic kidney disease, stage 3 unspecified: Secondary | ICD-10-CM

## 2023-04-19 MED ORDER — SITAGLIPTIN PHOSPHATE 50 MG PO TABS: 50.0000 mg | ORAL_TABLET | Freq: Every day | ORAL | 6 refills | Status: DC

## 2023-04-19 NOTE — Assessment & Plan Note (Signed)
LDL is target control. 

## 2023-04-19 NOTE — Assessment & Plan Note (Signed)
She walk with walker and no behavior issue, she has sitter and staff here give her medications.

## 2023-04-19 NOTE — Assessment & Plan Note (Signed)
Well controlled 

## 2023-04-19 NOTE — Patient Instructions (Signed)
I have spoken with her sister Clarisa Kindred and discuss with her about adding Januvia to better control her diabetes. I have also given her update about her condition.

## 2023-04-19 NOTE — Progress Notes (Signed)
   Office Visit  Subjective   Patient ID: Kim Adams   DOB: 27-Jan-1941   Age: 82 y.o.   MRN: [id number]   Chief Complaint Chief Complaint  Patient presents with   Follow-up    3 month follow up     History of Present Illness 82 years old female with history of diabetes and CKD 3a, who has labs drawn in March and that was reviewed my me today. She is going to see eye doctor in July for diabetic eye examination. She does not check her sugar at home.    She has hypertension and her blood pressure is good, her sitter make pill box for her and she take her medications.     She also has dyslipidemia and she take simvastatin. Her lipid panel was done in 3/24 and that was reviewed.    She is mentally slow but she can manage herself well, she walk with walker.   Past Medical History Past Medical History:  Diagnosis Date   Anxiety    Depression    Diabetes mellitus    Hypertension      Allergies Allergies  Allergen Reactions   Fosamax [Alendronate Sodium] Other (See Comments)    Esophageal problems     Review of Systems Review of Systems  Constitutional: Negative.   HENT: Negative.    Respiratory: Negative.    Cardiovascular: Negative.   Gastrointestinal: Negative.   Neurological: Negative.        Objective:    Vitals BP 118/60 (Patient Position: Sitting, Cuff Size: Normal)   Pulse 80   Temp 98.3 F (36.8 C)   Resp 18   Ht 5\' 8"  (1.727 m)   Wt 113 lb 8 oz (51.5 kg)   SpO2 98%   BMI 17.26 kg/m    Physical Examination Physical Exam Constitutional:      Appearance: Normal appearance.  HENT:     Head: Normocephalic and atraumatic.  Cardiovascular:     Rate and Rhythm: Normal rate and regular rhythm.     Heart sounds: Normal heart sounds.  Pulmonary:     Effort: Pulmonary effort is normal.     Breath sounds: Normal breath sounds.  Abdominal:     General: Bowel sounds are normal.     Palpations: Abdomen is soft.  Musculoskeletal:     Comments: She has  kyphosis but no tenderness        Assessment & Plan:   Primary hypertension Well controlled  Controlled diabetes mellitus with chronic kidney disease (HCC) Her kidney function have improved since jardiance was added but will discuss with her family about adding januvia to better control her diabetes  Dyslipidemia LDL is target control.  Mental developmental delay She walk with walker and no behavior issue, she has sitter and staff here give her medications.     Return in about 3 months (around 07/20/2023).   Eloisa Northern, MD

## 2023-04-19 NOTE — Assessment & Plan Note (Signed)
Her kidney function have improved since jardiance was added but will discuss with her family about adding januvia to better control her diabetes

## 2023-05-10 ENCOUNTER — Other Ambulatory Visit: Payer: Self-pay | Admitting: Internal Medicine

## 2023-05-10 DIAGNOSIS — Z1231 Encounter for screening mammogram for malignant neoplasm of breast: Secondary | ICD-10-CM

## 2023-05-27 DIAGNOSIS — H5203 Hypermetropia, bilateral: Secondary | ICD-10-CM | POA: Diagnosis not present

## 2023-06-14 ENCOUNTER — Ambulatory Visit
Admission: RE | Admit: 2023-06-14 | Discharge: 2023-06-14 | Disposition: A | Discharge status: Home / Self Care | Payer: Medicare Other | Source: Ambulatory Visit | Attending: Internal Medicine | Admitting: Internal Medicine

## 2023-06-14 ENCOUNTER — Ambulatory Visit: Payer: Medicare Other | Admitting: Internal Medicine

## 2023-06-14 ENCOUNTER — Encounter: Payer: Self-pay | Admitting: Internal Medicine

## 2023-06-14 VITALS — BP 118/78 | HR 95 | Temp 98.9°F | Resp 18 | Ht 68.0 in | Wt 114.4 lb

## 2023-06-14 DIAGNOSIS — Z1231 Encounter for screening mammogram for malignant neoplasm of breast: Secondary | ICD-10-CM

## 2023-06-14 DIAGNOSIS — E1122 Type 2 diabetes mellitus with diabetic chronic kidney disease: Secondary | ICD-10-CM

## 2023-06-14 DIAGNOSIS — N3001 Acute cystitis with hematuria: Secondary | ICD-10-CM

## 2023-06-14 DIAGNOSIS — I10 Essential (primary) hypertension: Secondary | ICD-10-CM | POA: Diagnosis not present

## 2023-06-14 DIAGNOSIS — F22 Delusional disorders: Secondary | ICD-10-CM | POA: Diagnosis not present

## 2023-06-14 DIAGNOSIS — N3000 Acute cystitis without hematuria: Secondary | ICD-10-CM | POA: Insufficient documentation

## 2023-06-14 DIAGNOSIS — N183 Chronic kidney disease, stage 3 unspecified: Secondary | ICD-10-CM | POA: Diagnosis not present

## 2023-06-14 LAB — POCT URINALYSIS DIPSTICK
Bilirubin, UA: NEGATIVE
Blood, UA: POSITIVE
Glucose, UA: POSITIVE — AB
Ketones, UA: 15
Nitrite, UA: NEGATIVE
Protein, UA: NEGATIVE
Spec Grav, UA: 1.01 (ref 1.010–1.025)
Urobilinogen, UA: 0.2 E.U./dL
pH, UA: 5 (ref 5.0–8.0)

## 2023-06-14 MED ORDER — CEFUROXIME AXETIL 500 MG PO TABS: 500.0000 mg | ORAL_TABLET | Freq: Two times a day (BID) | ORAL | 0 refills | Status: AC

## 2023-06-14 MED ORDER — QUETIAPINE FUMARATE 25 MG PO TABS: 25.0000 mg | ORAL_TABLET | Freq: Every day | ORAL | 1 refills | Status: DC

## 2023-06-14 NOTE — Assessment & Plan Note (Signed)
I will do HbA1c and CMP for evaluation of kidney function.

## 2023-06-14 NOTE — Assessment & Plan Note (Signed)
stable °

## 2023-06-14 NOTE — Progress Notes (Signed)
   Office Visit  Subjective   Patient ID: Kim Adams   DOB: 1941/04/28   Age: 82 y.o.   MRN: [id number]   Chief Complaint Chief Complaint  Patient presents with   office visit    Evaluation for need of anti anxiety      History of Present Illness 82 years old female is here because she has increased confusion, paranoid behavior, she thinks people are there to get her. She does come out only at night and shuffle through garbage. She also has increased urinary frequency. She has significant change in her behavior per staff.  She has history of diabetes, hyperlipidemia and hypertension.  Her blood pressure is controlled.  She takes simvastatin 10 mg daily and Januvia 50 mg daily.  I will do labs today for evaluation of altered mental status.  Past Medical History Past Medical History:  Diagnosis Date   Anxiety    Depression    Diabetes mellitus    Hypertension      Allergies Allergies  Allergen Reactions   Fosamax [Alendronate Sodium] Other (See Comments)    Esophageal problems     Review of Systems Review of Systems  Constitutional: Negative.   Cardiovascular: Negative.   Genitourinary:  Positive for frequency.  Musculoskeletal:  Positive for back pain.       Objective:    Vitals BP 118/78 (BP Location: Left Arm, Patient Position: Sitting, Cuff Size: Normal)   Pulse 95   Temp 98.9 F (37.2 C)   Resp 18   Ht 5\' 8"  (1.727 m)   Wt 114 lb 6 oz (51.9 kg)   SpO2 96%   BMI 17.39 kg/m    Physical Examination Physical Exam Constitutional:      Appearance: She is ill-appearing.  HENT:     Head: Normocephalic and atraumatic.  Cardiovascular:     Rate and Rhythm: Normal rate and regular rhythm.     Heart sounds: Normal heart sounds.  Pulmonary:     Effort: Pulmonary effort is normal.     Breath sounds: Normal breath sounds.  Abdominal:     General: Bowel sounds are normal.     Palpations: Abdomen is soft.  Neurological:     General: No focal deficit present.         Assessment & Plan:   Paranoid behavior (HCC) I will do UA and start her on seroquel 25 mg at night and then re-evaluate.   Controlled diabetes mellitus with chronic kidney disease (HCC) I will do HbA1c and CMP for evaluation of kidney function.  Primary hypertension stable  Acute cystitis She has large leukocyte esterase and 2000 mg glucose.  There was also hematuria.  Urine has strong smell.  I will send urine for culture when staff will bring urine sample tomorrow.  Then I will start her on cephalexin.  I will also start Jardiance.    Return in about 1 week (around 06/21/2023).   Eloisa Northern, MD

## 2023-06-14 NOTE — Assessment & Plan Note (Signed)
She has large leukocyte esterase and 2000 mg glucose.  There was also hematuria.  Urine has strong smell.  I will send urine for culture when staff will bring urine sample tomorrow.  Then I will start her on cephalexin.  I will also start Jardiance.

## 2023-06-14 NOTE — Assessment & Plan Note (Signed)
I will do UA and start her on seroquel 25 mg at night and then re-evaluate.

## 2023-06-15 ENCOUNTER — Other Ambulatory Visit: Payer: Self-pay | Admitting: Internal Medicine

## 2023-06-21 ENCOUNTER — Ambulatory Visit: Payer: Medicare Other | Admitting: Internal Medicine

## 2023-06-28 ENCOUNTER — Ambulatory Visit: Payer: Medicare Other | Admitting: Internal Medicine

## 2023-06-28 ENCOUNTER — Encounter: Payer: Self-pay | Admitting: Internal Medicine

## 2023-06-28 VITALS — BP 118/80 | HR 92 | Temp 98.2°F | Resp 18 | Ht 68.0 in | Wt 113.0 lb

## 2023-06-28 DIAGNOSIS — F22 Delusional disorders: Secondary | ICD-10-CM

## 2023-06-28 NOTE — Progress Notes (Signed)
   Office Visit  Subjective   Patient ID: Kim Adams   DOB: 11-26-1940   Age: 82 y.o.   MRN: [id number]   Chief Complaint Chief Complaint  Patient presents with   Follow-up    1 week follow up     History of Present Illness 82 years old female who is here for follow up. Per staff she is sleeping better during night and she sleep well. She tells me that thank you for taking "evil spirit "out of her and she is feeling much better. She still is complaining about people that they don't care about her that is why she does not go to dining room.   Past Medical History Past Medical History:  Diagnosis Date   Anxiety    Depression    Diabetes mellitus    Hypertension      Allergies Allergies  Allergen Reactions   Fosamax [Alendronate Sodium] Other (See Comments)    Esophageal problems     Review of Systems Review of Systems  Constitutional: Negative.   Respiratory: Negative.    Cardiovascular: Negative.        Objective:    Vitals BP 118/80 (BP Location: Left Arm, Patient Position: Sitting, Cuff Size: Normal)   Pulse 92   Temp 98.2 F (36.8 C)   Resp 18   Ht 5\' 8"  (1.727 m)   Wt 113 lb (51.3 kg)   SpO2 99%   BMI 17.18 kg/m    Physical Examination Physical Exam Constitutional:      Appearance: Normal appearance.  Cardiovascular:     Rate and Rhythm: Normal rate and regular rhythm.     Heart sounds: Normal heart sounds.  Pulmonary:     Effort: Pulmonary effort is normal.     Breath sounds: Normal breath sounds.  Neurological:     Mental Status: She is alert.     Comments: She is mentally slow with paranoid thoughts and walk with walker.         Assessment & Plan:   Paranoid behavior (HCC) Her behavior is better and she is sleeping good. I will continue with current medications.  Will re-evaluate in 1 month.    Return in about 1 month (around 07/29/2023).   Eloisa Northern, MD

## 2023-06-28 NOTE — Assessment & Plan Note (Signed)
Her behavior is better and she is sleeping good. I will continue with current medications.  Will re-evaluate in 1 month.

## 2023-07-05 ENCOUNTER — Other Ambulatory Visit: Payer: Self-pay | Admitting: Internal Medicine

## 2023-07-05 MED ORDER — QUETIAPINE FUMARATE 25 MG PO TABS: 12.5000 mg | ORAL_TABLET | Freq: Every day | ORAL | 2 refills | Status: DC

## 2023-07-11 ENCOUNTER — Other Ambulatory Visit: Payer: Self-pay | Admitting: Internal Medicine

## 2023-07-26 ENCOUNTER — Ambulatory Visit: Payer: Medicare Other | Admitting: Internal Medicine

## 2023-07-26 ENCOUNTER — Encounter: Payer: Self-pay | Admitting: Internal Medicine

## 2023-07-26 ENCOUNTER — Other Ambulatory Visit: Payer: Self-pay | Admitting: Internal Medicine

## 2023-07-26 VITALS — BP 110/70 | HR 102 | Temp 98.3°F | Resp 18 | Ht 68.0 in | Wt 117.0 lb

## 2023-07-26 DIAGNOSIS — E785 Hyperlipidemia, unspecified: Secondary | ICD-10-CM

## 2023-07-26 DIAGNOSIS — N183 Chronic kidney disease, stage 3 unspecified: Secondary | ICD-10-CM | POA: Diagnosis not present

## 2023-07-26 DIAGNOSIS — F22 Delusional disorders: Secondary | ICD-10-CM | POA: Diagnosis not present

## 2023-07-26 DIAGNOSIS — I10 Essential (primary) hypertension: Secondary | ICD-10-CM

## 2023-07-26 DIAGNOSIS — E1122 Type 2 diabetes mellitus with diabetic chronic kidney disease: Secondary | ICD-10-CM | POA: Diagnosis not present

## 2023-07-26 MED ORDER — QUETIAPINE FUMARATE 25 MG PO TABS: 25.0000 mg | ORAL_TABLET | Freq: Two times a day (BID) | ORAL | 2 refills | Status: DC

## 2023-07-26 NOTE — Assessment & Plan Note (Signed)
She is on simvastatin 10 mg daily and I will repeat labs today.

## 2023-07-26 NOTE — Assessment & Plan Note (Signed)
I will increase seroquel to 25 mg twice a day and monitor.

## 2023-07-26 NOTE — Progress Notes (Signed)
Office Visit  Subjective   Patient ID: Kim Adams   DOB: 1940-11-20   Age: 82 y.o.   MRN: [id number]   Chief Complaint Chief Complaint  Patient presents with   Follow-up    1 month follow up     History of Present Illness 82 years old female who is here for follow up. Per staff she is sleeping better during night and no behavior problem but during day time she started blaming staff again that they are mean to her, she has paranoid thoughts. She tell me that people are mean to her so she just hide herself.  She has diabetes mellitus with mild albuminuria. She does not check her sugar but her Hba1C was 7.4% in 3/24. She takes glimeperide, jardiance and Januvia 50 mg daily. She says that she has seen eye doctor this year and she was given new glasses but she does not wear it.   She also has hyperlipidemia and she takes simvastatin 10 mg daily. Her LDL was 90 in 3/24. I will repeat labs today.   She is mentally slow and mental retardation.   Past Medical History Past Medical History:  Diagnosis Date   Anxiety    Depression    Diabetes mellitus    Hypertension      Allergies Allergies  Allergen Reactions   Fosamax [Alendronate Sodium] Other (See Comments)    Esophageal problems     Review of Systems Review of Systems  Constitutional: Negative.   HENT: Negative.    Respiratory: Negative.    Cardiovascular: Negative.   Gastrointestinal: Negative.   Neurological:  Positive for tremors.       Objective:    Vitals BP 110/70 (BP Location: Left Leg, Patient Position: Sitting, Cuff Size: Normal)   Pulse (!) 102   Temp 98.3 F (36.8 C)   Resp 18   Ht 5\' 8"  (1.727 m)   Wt 117 lb (53.1 kg)   SpO2 98%   BMI 17.79 kg/m    Physical Examination Physical Exam Constitutional:      Appearance: Normal appearance.  Cardiovascular:     Rate and Rhythm: Normal rate and regular rhythm.     Heart sounds: Normal heart sounds.  Pulmonary:     Effort: Pulmonary effort is  normal.     Breath sounds: Normal breath sounds.  Abdominal:     General: Bowel sounds are normal.     Palpations: Abdomen is soft.  Neurological:     General: No focal deficit present.     Mental Status: She is alert and oriented to person, place, and time.        Assessment & Plan:   Primary hypertension Her blood pressure is controlled but her pulse is slightly high, I will monitor.  Controlled diabetes mellitus with chronic kidney disease (HCC) I will repeat labs today and re-evaluate.  Dyslipidemia She is on simvastatin 10 mg daily and I will repeat labs today.  Paranoid behavior (HCC) I will increase seroquel to 25 mg twice a day and monitor.    Return in about 3 months (around 10/25/2023).   Eloisa Northern, MD

## 2023-07-26 NOTE — Assessment & Plan Note (Signed)
I will repeat labs today and re-evaluate.

## 2023-07-26 NOTE — Assessment & Plan Note (Signed)
Her blood pressure is controlled but her pulse is slightly high, I will monitor.

## 2023-07-27 LAB — COMPREHENSIVE METABOLIC PANEL
ALT: 27 IU/L (ref 0–32)
AST: 34 IU/L (ref 0–40)
Albumin: 4.2 g/dL (ref 3.7–4.7)
Alkaline Phosphatase: 102 IU/L (ref 44–121)
BUN/Creatinine Ratio: 47 — ABNORMAL HIGH (ref 12–28)
BUN: 49 mg/dL — ABNORMAL HIGH (ref 8–27)
Bilirubin Total: <0.2 mg/dL (ref 0.0–1.2)
CO2: 20 mmol/L (ref 20–29)
Calcium: 9.3 mg/dL (ref 8.7–10.3)
Chloride: 108 mmol/L — ABNORMAL HIGH (ref 96–106)
Creatinine, Ser: 1.05 mg/dL — ABNORMAL HIGH (ref 0.57–1.00)
Globulin, Total: 1.7 g/dL (ref 1.5–4.5)
Glucose: 159 mg/dL — ABNORMAL HIGH (ref 70–99)
Potassium: 5.4 mmol/L — ABNORMAL HIGH (ref 3.5–5.2)
Sodium: 142 mmol/L (ref 134–144)
Total Protein: 5.9 g/dL — ABNORMAL LOW (ref 6.0–8.5)
eGFR: 53 mL/min/{1.73_m2} — ABNORMAL LOW (ref >59)

## 2023-07-27 LAB — LIPID PANEL W/O CHOL/HDL RATIO
Cholesterol, Total: 180 mg/dL (ref 100–199)
HDL: 66 mg/dL (ref >39)
LDL Chol Calc (NIH): 73 mg/dL (ref 0–99)
Triglycerides: 258 mg/dL — ABNORMAL HIGH (ref 0–149)
VLDL Cholesterol Cal: 41 mg/dL — ABNORMAL HIGH (ref 5–40)

## 2023-07-27 LAB — HGB A1C W/O EAG: Hgb A1c MFr Bld: 7.2 % — ABNORMAL HIGH (ref 4.8–5.6)

## 2023-08-08 ENCOUNTER — Other Ambulatory Visit: Payer: Self-pay

## 2023-08-08 MED ORDER — GLIMEPIRIDE 2 MG PO TABS: 2.0000 mg | ORAL_TABLET | Freq: Every day | ORAL | 10 refills | Status: DC

## 2023-08-16 ENCOUNTER — Other Ambulatory Visit: Payer: Self-pay | Admitting: Internal Medicine

## 2023-08-16 DIAGNOSIS — F22 Delusional disorders: Secondary | ICD-10-CM

## 2023-08-16 MED ORDER — DIVALPROEX SODIUM 125 MG PO DR TAB: 125.0000 mg | DELAYED_RELEASE_TABLET | Freq: Two times a day (BID) | ORAL | 2 refills | Status: DC

## 2023-08-23 ENCOUNTER — Other Ambulatory Visit: Payer: Self-pay | Admitting: Internal Medicine

## 2023-08-23 MED ORDER — DIVALPROEX SODIUM 250 MG PO DR TAB: 250.0000 mg | DELAYED_RELEASE_TABLET | Freq: Three times a day (TID) | ORAL | 3 refills | Status: DC

## 2023-08-23 MED ORDER — QUETIAPINE FUMARATE 50 MG PO TABS: 50.0000 mg | ORAL_TABLET | Freq: Two times a day (BID) | ORAL | 3 refills | Status: DC

## 2023-09-08 ENCOUNTER — Other Ambulatory Visit: Payer: Self-pay

## 2023-09-08 MED ORDER — SIMVASTATIN 10 MG PO TABS: 10.0000 mg | ORAL_TABLET | Freq: Every evening | ORAL | 10 refills | Status: DC

## 2023-09-08 MED ORDER — ENALAPRIL MALEATE 5 MG PO TABS: 5.0000 mg | ORAL_TABLET | Freq: Every day | ORAL | 10 refills | Status: DC

## 2023-10-04 ENCOUNTER — Other Ambulatory Visit: Payer: Self-pay

## 2023-10-04 ENCOUNTER — Emergency Department (HOSPITAL_COMMUNITY)
Admission: EM | Admit: 2023-10-04 | Discharge: 2023-10-04 | Disposition: A | Discharge status: Home / Self Care | Payer: Medicare Other | Attending: Ophthalmology | Admitting: Ophthalmology

## 2023-10-04 ENCOUNTER — Encounter (HOSPITAL_COMMUNITY): Payer: Self-pay | Admitting: Ophthalmology

## 2023-10-04 DIAGNOSIS — K922 Gastrointestinal hemorrhage, unspecified: Secondary | ICD-10-CM | POA: Diagnosis not present

## 2023-10-04 DIAGNOSIS — E119 Type 2 diabetes mellitus without complications: Secondary | ICD-10-CM | POA: Insufficient documentation

## 2023-10-04 DIAGNOSIS — Z7984 Long term (current) use of oral hypoglycemic drugs: Secondary | ICD-10-CM | POA: Diagnosis not present

## 2023-10-04 DIAGNOSIS — R739 Hyperglycemia, unspecified: Secondary | ICD-10-CM | POA: Diagnosis not present

## 2023-10-04 DIAGNOSIS — R197 Diarrhea, unspecified: Secondary | ICD-10-CM | POA: Diagnosis present

## 2023-10-04 DIAGNOSIS — I10 Essential (primary) hypertension: Secondary | ICD-10-CM | POA: Insufficient documentation

## 2023-10-04 DIAGNOSIS — R58 Hemorrhage, not elsewhere classified: Secondary | ICD-10-CM | POA: Diagnosis not present

## 2023-10-04 LAB — BASIC METABOLIC PANEL
Anion gap: 9 (ref 5–15)
BUN: 46 mg/dL — ABNORMAL HIGH (ref 8–23)
CO2: 23 mmol/L (ref 22–32)
Calcium: 9.1 mg/dL (ref 8.9–10.3)
Chloride: 105 mmol/L (ref 98–111)
Creatinine, Ser: 1.05 mg/dL — ABNORMAL HIGH (ref 0.44–1.00)
GFR, Estimated: 53 mL/min — ABNORMAL LOW (ref >60)
Glucose, Bld: 128 mg/dL — ABNORMAL HIGH (ref 70–99)
Potassium: 4.2 mmol/L (ref 3.5–5.1)
Sodium: 137 mmol/L (ref 135–145)

## 2023-10-04 LAB — CBC WITH DIFFERENTIAL/PLATELET
Abs Immature Granulocytes: 0.05 10*3/uL (ref 0.00–0.07)
Basophils Absolute: 0 10*3/uL (ref 0.0–0.1)
Basophils Relative: 1 %
Eosinophils Absolute: 0.1 10*3/uL (ref 0.0–0.5)
Eosinophils Relative: 2 %
HCT: 32.5 % — ABNORMAL LOW (ref 36.0–46.0)
Hemoglobin: 10.7 g/dL — ABNORMAL LOW (ref 12.0–15.0)
Immature Granulocytes: 1 %
Lymphocytes Relative: 22 %
Lymphs Abs: 1.4 10*3/uL (ref 0.7–4.0)
MCH: 31.3 pg (ref 26.0–34.0)
MCHC: 32.9 g/dL (ref 30.0–36.0)
MCV: 95 fL (ref 80.0–100.0)
Monocytes Absolute: 0.7 10*3/uL (ref 0.1–1.0)
Monocytes Relative: 12 %
Neutro Abs: 4 10*3/uL (ref 1.7–7.7)
Neutrophils Relative %: 62 %
Platelets: 177 10*3/uL (ref 150–400)
RBC: 3.42 MIL/uL — ABNORMAL LOW (ref 3.87–5.11)
RDW: 13.1 % (ref 11.5–15.5)
WBC: 6.3 10*3/uL (ref 4.0–10.5)
nRBC: 0 % (ref 0.0–0.2)

## 2023-10-04 LAB — TYPE AND SCREEN
ABO/RH(D): A POS
Antibody Screen: NEGATIVE

## 2023-10-04 NOTE — ED Provider Notes (Signed)
[place] EMERGENCY DEPARTMENT AT [facility] Provider Note   CSN: [id number] Arrival date & time: 10/04/23  1455     History  No chief complaint on file.   Kim Adams is a 82 y.o. female.  HPI Prevents her [employer] [facility].  Reportedly caregivers were called and found to have blood in both the toilet and in the bathroom.  Reportedly bright red.  Reportedly may have either hemorrhoids or potentially prolapse.  Has had 2 days of diarrhea.  Not on blood thinners.  No reported previous history of the same.   Past Medical History:  Diagnosis Date   Anxiety    Depression    Diabetes mellitus    Hypertension     Home Medications Prior to Admission medications   Medication Sig Start Date End Date Taking? Authorizing Provider  Calcium Carbonate-Vit D-Min (CALCIUM/VITAMIN D/MINERALS) 600-200 MG-UNIT TABS Take 1 tablet by mouth daily.    [provider]  cefUROXime (CEFTIN) 500 MG tablet Take 1 tablet (500 mg total) by mouth 2 (two) times daily with a meal. 06/14/23   Eloisa Northern, MD  divalproex (DEPAKOTE) 250 MG DR tablet Take 1 tablet (250 mg total) by mouth 3 (three) times daily. 08/23/23   Eloisa Northern, MD  enalapril (VASOTEC) 5 MG tablet Take 1 tablet (5 mg total) by mouth daily. 09/08/23   Eloisa Northern, MD  ferrous sulfate 325 (65 FE) MG tablet Take 325 mg by mouth every Monday, Wednesday, and Friday.    [provider]  furosemide (LASIX) 20 MG tablet TAKE 1 TABLET BY MOUTH ONCE DAILY 07/12/23   Eloisa Northern, MD  glimepiride (AMARYL) 2 MG tablet Take 1 tablet (2 mg total) by mouth daily with breakfast. 08/08/23   Eloisa Northern, MD  HYDROcodone-acetaminophen (NORCO/VICODIN) 5-325 MG tablet Take 1-2 tablets by mouth every 6 (six) hours as needed for moderate pain. 01/07/20   Burnadette Pop, MD  JARDIANCE 10 MG TABS tablet Take 10 mg by mouth daily. 12/18/19   [provider]  labetalol (NORMODYNE) 100 MG tablet TAKE 1/4 TABLET = 25 MG BY MOUTH TWICE DAILY *NOTE  DOSE* 07/26/23   Eloisa Northern, MD  polyethylene glycol (MIRALAX / GLYCOLAX) 17 g packet Take 17 g by mouth daily. 01/08/20   Burnadette Pop, MD  QUEtiapine (SEROQUEL) 50 MG tablet Take 1 tablet (50 mg total) by mouth 2 (two) times daily. 08/23/23   Eloisa Northern, MD  senna (SENOKOT) 8.6 MG TABS tablet Take 1 tablet (8.6 mg total) by mouth daily. 01/08/20   Burnadette Pop, MD  simvastatin (ZOCOR) 10 MG tablet Take 1 tablet (10 mg total) by mouth every evening. 09/08/23   Eloisa Northern, MD  sitaGLIPtin (JANUVIA) 50 MG tablet Take 1 tablet (50 mg total) by mouth daily. 04/19/23   Eloisa Northern, MD      Allergies    Fosamax [alendronate sodium]    Review of Systems   Review of Systems  Physical Exam Updated Vital Signs BP (!) 136/103   Pulse 87   Temp 98.3 F (36.8 C)   Resp 18   SpO2 99%  Physical Exam Vitals and nursing note reviewed.  HENT:     Head: Normocephalic.  Cardiovascular:     Rate and Rhythm: Regular rhythm.  Abdominal:     Tenderness: There is no abdominal tenderness.  Musculoskeletal:     Cervical back: Neck supple.  Skin:    General: Skin is warm.  Neurological:     Mental Status:  She is alert and oriented to person, place, and time.     ED Results / Procedures / Treatments   Labs (all labs ordered are listed, but only abnormal results are displayed) Labs Reviewed  CBC WITH DIFFERENTIAL/PLATELET - Abnormal; Notable for the following components:      Result Value   RBC 3.42 (*)    Hemoglobin 10.7 (*)    HCT 32.5 (*)    All other components within normal limits  BASIC METABOLIC PANEL - Abnormal; Notable for the following components:   Glucose, Bld 128 (*)    BUN 46 (*)    Creatinine, Ser 1.05 (*)    GFR, Estimated 53 (*)    All other components within normal limits  POC OCCULT BLOOD, ED  TYPE AND SCREEN    EKG None  Radiology No results found.  Procedures Procedures    Medications Ordered in ED Medications - No data to display  ED Course/ Medical  Decision Making/ A&P                                 Medical Decision Making Amount and/or Complexity of Data Reviewed Labs: ordered.   Patient with diarrhea and also had blood in bathroom.  Differential diagnosis includes GI bleed, also hemorrhoids.  Has had both normal blood pressures and low blood pressure since has been here.  Hemoglobin similar to prior.  Rectal exam done and did show a small amount of gross blood.  No severe active bleeding however.  Did have some dried blood in her briefs but no continued bleeding.  Discussed with patient and caregiver.  They would rather her go home and I think this is reasonable.  Can follow as an outpatient.  Will return for worsening symptoms.  Bleeding has seemed to is slowed.  Did have external hemorrhoid but no stigmata of bleeding on it.        Final Clinical Impression(s) / ED Diagnoses Final diagnoses:  Lower GI bleed    Rx / DC Orders ED Discharge Orders     None         Benjiman Core, MD 10/04/23 2055

## 2023-10-04 NOTE — ED Triage Notes (Signed)
EMS reports from, [facility], called out for GI bleed today starting an hour ago. Bright red. Hx of hemorrhoids. Pt not on blood thinners. Pt states diarrhea x 2 days.  BP 120/68 HR 100 RR 18 Spo2 98 RA CBG 253

## 2023-10-04 NOTE — Discharge Instructions (Addendum)
Watch for increased bleeding.  Watch for low blood pressures or lightheadedness.

## 2023-10-04 NOTE — ED Notes (Addendum)
Pt called to changed was worried brief was full of blood. Pt was changed and there was dry blood inside brief nothing fresh. Pt was not currently bleeding from rectum.

## 2023-10-04 NOTE — ED Notes (Signed)
Health and wellness cooridinator from [facility] is taking pt back to facility, Kim Adams is name of person.

## 2023-10-11 ENCOUNTER — Other Ambulatory Visit: Payer: Self-pay | Admitting: Internal Medicine

## 2023-10-11 ENCOUNTER — Encounter: Payer: Self-pay | Admitting: Internal Medicine

## 2023-10-11 ENCOUNTER — Ambulatory Visit: Payer: Medicare Other | Admitting: Internal Medicine

## 2023-10-11 VITALS — BP 116/68 | HR 76 | Temp 98.4°F | Resp 18 | Ht 68.0 in | Wt 82.2 lb

## 2023-10-11 DIAGNOSIS — K649 Unspecified hemorrhoids: Secondary | ICD-10-CM

## 2023-10-11 DIAGNOSIS — E1122 Type 2 diabetes mellitus with diabetic chronic kidney disease: Secondary | ICD-10-CM | POA: Diagnosis not present

## 2023-10-11 DIAGNOSIS — K625 Hemorrhage of anus and rectum: Secondary | ICD-10-CM | POA: Diagnosis not present

## 2023-10-11 DIAGNOSIS — N183 Chronic kidney disease, stage 3 unspecified: Secondary | ICD-10-CM | POA: Diagnosis not present

## 2023-10-11 NOTE — Progress Notes (Signed)
   Office Visit  Subjective   Patient ID: Kim Adams   DOB: December 11, 1940   Age: 82 y.o.   MRN: [id number]   Chief Complaint Chief Complaint  Patient presents with   Follow-up    ER follow up     History of Present Illness 82 years old female who is here for follow-up from [facility] emergency room where she was sent for lower GI bleed.  She tells me that she has hemorrhoid and no further bleeding after that episode.  Her hemoglobin was stable.  Staff also reported that they have not aware of any further bleeding after that episode.  I have discussed with her that she need to have a repeat CBC to make sure that hemoglobin is stable. Staff also report that her behavior is also stable.  She does not take Depakote because sister was afraid that she is more sleepy.  He does take Seroquel and her paranoid thoughts and behaviors seems better but she has not seen any psychiatrist yet.  Past Medical History Past Medical History:  Diagnosis Date   Anxiety    Depression    Diabetes mellitus    Hypertension      Allergies Allergies  Allergen Reactions   Fosamax [Alendronate Sodium] Other (See Comments)    Esophageal problems     Review of Systems Review of Systems  Constitutional: Negative.   Respiratory: Negative.    Cardiovascular: Negative.   Gastrointestinal: Negative.        Objective:    Vitals BP 116/68 (BP Location: Left Arm, Patient Position: Sitting, Cuff Size: Normal)   Pulse 76   Temp 98.4 F (36.9 C)   Resp 18   Ht 5\' 8"  (1.727 m)   Wt 82 lb 4 oz (37.3 kg)   SpO2 99%   BMI 12.51 kg/m    Physical Examination Physical Exam Constitutional:      Appearance: Normal appearance.  HENT:     Head: Normocephalic and atraumatic.  Cardiovascular:     Rate and Rhythm: Normal rate and regular rhythm.     Heart sounds: Normal heart sounds.  Pulmonary:     Effort: Pulmonary effort is normal.     Breath sounds: Normal breath sounds.  Abdominal:     General: Bowel  sounds are normal.     Palpations: Abdomen is soft.  Neurological:     General: No focal deficit present.     Mental Status: She is alert.        Assessment & Plan:   Hemorrhoids Any constipation and denies any further bleeding.  We will monitor.  Gastrointestinal hemorrhage associated with anorectal source Patient and staff report no further bleeding but I will do CBC to make sure hemoglobin is stable as well.  Controlled diabetes mellitus with chronic kidney disease (HCC) I will see her back for diabetes and renal function evaluation.    No follow-ups on file.   Eloisa Northern, MD

## 2023-10-12 LAB — CBC WITH DIFFERENTIAL/PLATELET
Basophils Absolute: 0 10*3/uL (ref 0.0–0.2)
Basos: 1 %
EOS (ABSOLUTE): 0.2 10*3/uL (ref 0.0–0.4)
Eos: 3 %
Hematocrit: 33.5 % — ABNORMAL LOW (ref 34.0–46.6)
Hemoglobin: 11 g/dL — ABNORMAL LOW (ref 11.1–15.9)
Immature Grans (Abs): 0.1 10*3/uL (ref 0.0–0.1)
Immature Granulocytes: 1 %
Lymphocytes Absolute: 1.5 10*3/uL (ref 0.7–3.1)
Lymphs: 23 %
MCH: 30.7 pg (ref 26.6–33.0)
MCHC: 32.8 g/dL (ref 31.5–35.7)
MCV: 94 fL (ref 79–97)
Monocytes Absolute: 0.9 10*3/uL (ref 0.1–0.9)
Monocytes: 14 %
Neutrophils Absolute: 3.8 10*3/uL (ref 1.4–7.0)
Neutrophils: 58 %
Platelets: 211 10*3/uL (ref 150–450)
RBC: 3.58 x10E6/uL — ABNORMAL LOW (ref 3.77–5.28)
RDW: 12.4 % (ref 11.7–15.4)
WBC: 6.5 10*3/uL (ref 3.4–10.8)

## 2023-10-13 ENCOUNTER — Other Ambulatory Visit: Payer: Self-pay

## 2023-10-13 MED ORDER — GLIMEPIRIDE 2 MG PO TABS: 2.0000 mg | ORAL_TABLET | Freq: Every day | ORAL | 10 refills | Status: DC

## 2023-10-18 ENCOUNTER — Encounter: Payer: Self-pay | Admitting: Internal Medicine

## 2023-10-18 ENCOUNTER — Other Ambulatory Visit: Payer: Self-pay | Admitting: Internal Medicine

## 2023-10-18 ENCOUNTER — Ambulatory Visit: Payer: Medicare Other | Admitting: Internal Medicine

## 2023-10-18 VITALS — BP 116/64 | HR 109 | Temp 98.0°F | Resp 18 | Ht 68.0 in | Wt 117.0 lb

## 2023-10-18 DIAGNOSIS — I10 Essential (primary) hypertension: Secondary | ICD-10-CM | POA: Diagnosis not present

## 2023-10-18 DIAGNOSIS — E1122 Type 2 diabetes mellitus with diabetic chronic kidney disease: Secondary | ICD-10-CM

## 2023-10-18 DIAGNOSIS — F22 Delusional disorders: Secondary | ICD-10-CM

## 2023-10-18 DIAGNOSIS — Z681 Body mass index (BMI) 19 or less, adult: Secondary | ICD-10-CM | POA: Diagnosis not present

## 2023-10-18 DIAGNOSIS — Z0000 Encounter for general adult medical examination without abnormal findings: Secondary | ICD-10-CM | POA: Insufficient documentation

## 2023-10-18 DIAGNOSIS — N183 Chronic kidney disease, stage 3 unspecified: Secondary | ICD-10-CM | POA: Diagnosis not present

## 2023-10-18 DIAGNOSIS — E785 Hyperlipidemia, unspecified: Secondary | ICD-10-CM | POA: Diagnosis not present

## 2023-10-18 DIAGNOSIS — F819 Developmental disorder of scholastic skills, unspecified: Secondary | ICD-10-CM

## 2023-10-18 NOTE — Assessment & Plan Note (Signed)
Controlled but her pulse is 109, she is very anxious. She is on labetalol and will monitor her pulse.

## 2023-10-18 NOTE — Assessment & Plan Note (Addendum)
I will do lipid panel today.

## 2023-10-18 NOTE — Assessment & Plan Note (Signed)
I will do HbA1c and CMP today.

## 2023-10-18 NOTE — Assessment & Plan Note (Signed)
stable °

## 2023-10-18 NOTE — Progress Notes (Addendum)
   Office Visit  Subjective   Patient ID: Kim Adams   DOB: Aug 05, 1941   Age: 82 y.o.   MRN: [id number]   Chief Complaint No chief complaint on file.    History of Present Illness 82 years old is here for annual wellness examination. She live alone, she get food delivered.  She has recently GI bleed but that has stopped.  She can bathe herself, dress herself.  She walks around with a walker.  She does not drive.  He is making slow and has some behavioral problem where she was having thoughts that staff are hurting her but that issue has resolved.  She is sleeping good.  She is on Seroquel 50 mg twice a day but Depakote was stopped because she was too sleepy.  She has diabetes mellitus and she is due for her hemoglobin A1c today.  She has not has eye exam this year that need to be done.  Did take Januvia 50 mg, glimepiride 2 mg and Jardiance 10 mg daily.  She has dyslipidemia and take simvastatin 10 mg daily.  I will do lipid panel.  She also has hypertension and take enalapril 5 mg daily and Lasix 20 mg as needed.  She has flu shot and need to discuss with staff about her vaccination.     Past Medical History Past Medical History:  Diagnosis Date   Anxiety    Depression    Diabetes mellitus    Hypertension      Allergies Allergies  Allergen Reactions   Fosamax [Alendronate Sodium] Other (See Comments)    Esophageal problems     Review of Systems Review of Systems  Constitutional: Negative.   Respiratory: Negative.    Cardiovascular: Negative.   Gastrointestinal: Negative.   Neurological:        She is mentally slow but behavior is stable.        Objective:    Vitals BP 116/64 (BP Location: Left Arm, Patient Position: Sitting, Cuff Size: Normal)   Pulse (!) 109   Temp 98 F (36.7 C)   Resp 18   Ht 5\' 8"  (1.727 m)   Wt 117 lb (53.1 kg)   SpO2 99%   BMI 17.79 kg/m    Physical Examination Physical Exam Constitutional:      Appearance: Normal appearance.   HENT:     Head: Normocephalic and atraumatic.  Cardiovascular:     Rate and Rhythm: Normal rate and regular rhythm.     Heart sounds: Normal heart sounds.  Pulmonary:     Effort: Pulmonary effort is normal.     Breath sounds: Normal breath sounds.  Abdominal:     General: Bowel sounds are normal.     Palpations: Abdomen is soft.  Neurological:     Comments: She is mentally slow but is able to communicate well today. She walk with walker and has not fall.         Assessment & Plan:   Primary hypertension Controlled but her pulse is 109, she is very anxious. She is on labetalol and will monitor her pulse.  Controlled diabetes mellitus with chronic kidney disease (HCC) I will do HbA1c and CMP today.  Paranoid behavior (HCC) Better with seroquil.  Mental developmental delay stable  Dyslipidemia I will do lipid panel today.    Return in about 1 month (around 11/18/2023).   Eloisa Northern, MD

## 2023-10-18 NOTE — Assessment & Plan Note (Signed)
Better with seroquil.

## 2023-10-19 ENCOUNTER — Ambulatory Visit (HOSPITAL_COMMUNITY): Payer: Self-pay | Admitting: Psychiatry

## 2023-10-19 LAB — LIPID PANEL W/O CHOL/HDL RATIO
Cholesterol, Total: 197 mg/dL (ref 100–199)
HDL: 76 mg/dL (ref >39)
LDL Chol Calc (NIH): 104 mg/dL — ABNORMAL HIGH (ref 0–99)
Triglycerides: 97 mg/dL (ref 0–149)
VLDL Cholesterol Cal: 17 mg/dL (ref 5–40)

## 2023-10-19 LAB — CBC WITH DIFFERENTIAL/PLATELET
Basophils Absolute: 0.1 10*3/uL (ref 0.0–0.2)
Basos: 1 %
EOS (ABSOLUTE): 0.2 10*3/uL (ref 0.0–0.4)
Eos: 3 %
Hematocrit: 35 % (ref 34.0–46.6)
Hemoglobin: 11.5 g/dL (ref 11.1–15.9)
Immature Grans (Abs): 0.1 10*3/uL (ref 0.0–0.1)
Immature Granulocytes: 1 %
Lymphocytes Absolute: 1.2 10*3/uL (ref 0.7–3.1)
Lymphs: 19 %
MCH: 30.3 pg (ref 26.6–33.0)
MCHC: 32.9 g/dL (ref 31.5–35.7)
MCV: 92 fL (ref 79–97)
Monocytes Absolute: 0.9 10*3/uL (ref 0.1–0.9)
Monocytes: 14 %
Neutrophils Absolute: 4 10*3/uL (ref 1.4–7.0)
Neutrophils: 62 %
Platelets: 225 10*3/uL (ref 150–450)
RBC: 3.79 x10E6/uL (ref 3.77–5.28)
RDW: 12.6 % (ref 11.7–15.4)
WBC: 6.4 10*3/uL (ref 3.4–10.8)

## 2023-10-19 LAB — COMPREHENSIVE METABOLIC PANEL
ALT: 25 [IU]/L (ref 0–32)
AST: 35 [IU]/L (ref 0–40)
Albumin: 4.3 g/dL (ref 3.7–4.7)
Alkaline Phosphatase: 97 [IU]/L (ref 44–121)
BUN/Creatinine Ratio: 38 — ABNORMAL HIGH (ref 12–28)
BUN: 48 mg/dL — ABNORMAL HIGH (ref 8–27)
Bilirubin Total: 0.4 mg/dL (ref 0.0–1.2)
CO2: 22 mmol/L (ref 20–29)
Calcium: 9.7 mg/dL (ref 8.7–10.3)
Chloride: 105 mmol/L (ref 96–106)
Creatinine, Ser: 1.26 mg/dL — ABNORMAL HIGH (ref 0.57–1.00)
Globulin, Total: 2.1 g/dL (ref 1.5–4.5)
Glucose: 102 mg/dL — ABNORMAL HIGH (ref 70–99)
Potassium: 5.4 mmol/L — ABNORMAL HIGH (ref 3.5–5.2)
Sodium: 142 mmol/L (ref 134–144)
Total Protein: 6.4 g/dL (ref 6.0–8.5)
eGFR: 43 mL/min/{1.73_m2} — ABNORMAL LOW (ref >59)

## 2023-10-19 LAB — HGB A1C W/O EAG: Hgb A1c MFr Bld: 7.4 % — ABNORMAL HIGH (ref 4.8–5.6)

## 2023-10-19 LAB — VITAMIN D 25 HYDROXY (VIT D DEFICIENCY, FRACTURES): Vit D, 25-Hydroxy: 44.9 ng/mL (ref 30.0–100.0)

## 2023-10-19 LAB — TSH: TSH: 2.33 u[IU]/mL (ref 0.450–4.500)

## 2023-10-20 ENCOUNTER — Ambulatory Visit (HOSPITAL_COMMUNITY): Payer: Self-pay | Admitting: Psychiatry

## 2023-10-24 DIAGNOSIS — K625 Hemorrhage of anus and rectum: Secondary | ICD-10-CM | POA: Insufficient documentation

## 2023-10-24 DIAGNOSIS — K649 Unspecified hemorrhoids: Secondary | ICD-10-CM | POA: Insufficient documentation

## 2023-10-24 NOTE — Assessment & Plan Note (Signed)
Any constipation and denies any further bleeding.  We will monitor.

## 2023-10-24 NOTE — Assessment & Plan Note (Signed)
Patient and staff report no further bleeding but I will do CBC to make sure hemoglobin is stable as well.

## 2023-10-24 NOTE — Assessment & Plan Note (Signed)
I will see her back for diabetes and renal function evaluation.

## 2023-10-25 ENCOUNTER — Ambulatory Visit: Payer: Medicare Other | Admitting: Internal Medicine

## 2023-10-25 VITALS — BP 120/70 | HR 71 | Temp 97.8°F | Resp 18 | Ht 68.0 in | Wt 117.5 lb

## 2023-10-25 DIAGNOSIS — F819 Developmental disorder of scholastic skills, unspecified: Secondary | ICD-10-CM

## 2023-10-25 DIAGNOSIS — F22 Delusional disorders: Secondary | ICD-10-CM | POA: Diagnosis not present

## 2023-10-25 DIAGNOSIS — H6123 Impacted cerumen, bilateral: Secondary | ICD-10-CM | POA: Insufficient documentation

## 2023-10-25 DIAGNOSIS — F419 Anxiety disorder, unspecified: Secondary | ICD-10-CM | POA: Insufficient documentation

## 2023-10-25 MED ORDER — CITALOPRAM HYDROBROMIDE 10 MG PO TABS: 10.0000 mg | ORAL_TABLET | Freq: Every day | ORAL | 4 refills | Status: DC

## 2023-10-25 MED ORDER — CARBAMIDE PEROXIDE 6.5 % OT SOLN: 5.0000 [drp] | Freq: Two times a day (BID) | OTIC | 2 refills | Status: AC

## 2023-10-25 NOTE — Progress Notes (Addendum)
   Acute Office Visit  Subjective:     Patient ID: Kim Adams, female    DOB: 28-Dec-1940, 82 y.o.   MRN: [id number]  Chief Complaint  Patient presents with   office visit    Spot on head scaly and yellow , ear check     HPI Patient is in today for difficulty hearing and psych referral.  She has paranoid thoughts and was very agitated and upset, was not going low to meet people as she was telling that people are talking bad about her and are going to hurt her. I have started her on seroquil and depakote as mode stabilizer but her daughter does not want her to take it. I have referred her to see psychiatrist evaluation but she was not discharge from another behavior health service.  She also has difficulty in hearing and is hearing for evaluation to make sure that she does not have wax again.    Review of Systems  Constitutional: Negative.   HENT:  Positive for hearing loss.   Psychiatric/Behavioral:  The patient is nervous/anxious.         Objective:    BP 120/70 (BP Location: Left Arm, Patient Position: Sitting, Cuff Size: Normal)   Pulse 71   Temp 97.8 F (36.6 C)   Resp 18   Ht 5\' 8"  (1.727 m)   Wt 117 lb 8 oz (53.3 kg)   SpO2 97%   BMI 17.87 kg/m    Physical Exam Constitutional:      Appearance: Normal appearance.  HENT:     Right Ear: There is impacted cerumen.     Left Ear: There is impacted cerumen.  Pulmonary:     Effort: Pulmonary effort is normal.     Breath sounds: Normal breath sounds.  Neurological:     Mental Status: She is alert.     Comments: She is mentally slow and walk with walker.      No results found for any visits on 10/25/23.      Assessment & Plan:   Problem List Items Addressed This Visit       Nervous and Auditory   Excessive cerumen in both ear canals - Primary   She will put debrox ear drops both ears 5 drops twice a day for 5 days then she will come here for ear irrigations.        Other   Mental developmental delay    Relevant Orders   Ambulatory referral to Psychiatry   Paranoid behavior [facility])   I will refer her to see psychiatrist      Relevant Orders   Ambulatory referral to Psychiatry   Anxiety   I will start her to citalopram 10 mg daily and will refer her to see psychiatrist for paranoid thoughts and anxiety disorder.       Meds ordered this encounter  Medications   carbamide peroxide (DEBROX) 6.5 % OTIC solution    Sig: Place 5 drops into the right ear 2 (two) times daily.    Dispense:  15 mL    Refill:  2    Return in about 2 weeks (around 11/08/2023).  Eloisa Northern, MD

## 2023-10-25 NOTE — Addendum Note (Signed)
Addended byEloisa Northern on: 10/25/2023 04:18 PM   Modules accepted: Orders

## 2023-10-25 NOTE — Assessment & Plan Note (Signed)
She will put debrox ear drops both ears 5 drops twice a day for 5 days then she will come here for ear irrigations.

## 2023-10-25 NOTE — Assessment & Plan Note (Signed)
I will start her to citalopram 10 mg daily and will refer her to see psychiatrist for paranoid thoughts and anxiety disorder.

## 2023-10-25 NOTE — Assessment & Plan Note (Signed)
I will refer her to see psychiatrist

## 2023-11-08 ENCOUNTER — Ambulatory Visit: Payer: Medicare Other | Admitting: Internal Medicine

## 2023-11-08 VITALS — BP 110/64 | HR 88 | Temp 98.0°F | Resp 18 | Ht 68.0 in | Wt 114.0 lb

## 2023-11-08 DIAGNOSIS — H6123 Impacted cerumen, bilateral: Secondary | ICD-10-CM | POA: Diagnosis not present

## 2023-11-08 DIAGNOSIS — F22 Delusional disorders: Secondary | ICD-10-CM

## 2023-11-08 NOTE — Progress Notes (Signed)
   Acute Office Visit  Subjective:     Patient ID: Kim Adams, female    DOB: 02-16-1941, 82 y.o.   MRN: [id number]  Chief Complaint  Patient presents with   Follow-up    2 WEEK FOLLOW UP    HPI Patient is in today for wax in her both ears. She says that she has used Debrox eardrops in both the ears.  She still has paranoid thoughts and tells me that she does not wanted to go to dining room because that is a war zone.  I have started her on Seroquel  and then Depakote  was added but on sister's request Depakote  was stopped.  I have started her on citalopram  for anxiety and referred her to see psychiatrist.  Review of Systems  HENT:  Positive for hearing loss.   Psychiatric/Behavioral:         Paranoid thoughts        Objective:    BP 110/64 (BP Location: Left Arm, Patient Position: Sitting, Cuff Size: Normal)   Pulse 88   Temp 98 F (36.7 C)   Resp 18   Ht 5' 8 (1.727 m)   Wt 114 lb (51.7 kg)   SpO2 99%   BMI 17.33 kg/m    Physical Exam HENT:     Right Ear: There is impacted cerumen.     Left Ear: There is impacted cerumen.  Neurological:     General: No focal deficit present.     Mental Status: She is alert.     No results found for any visits on 11/08/23.      Assessment & Plan:   Problem List Items Addressed This Visit       Nervous and Auditory   Excessive cerumen in both ear canals - Primary   With water Irrigation and curette we were able to remove wax from both the ears.        Other   Paranoid behavior (HCC)   She will follow-up with psychiatrist.       No orders of the defined types were placed in this encounter.   No follow-ups on file.  Roetta Dare, MD

## 2023-11-08 NOTE — Assessment & Plan Note (Signed)
She will follow-up with psychiatrist.

## 2023-11-08 NOTE — Assessment & Plan Note (Signed)
With water Irrigation and curette we were able to remove wax from both the ears.

## 2023-11-10 ENCOUNTER — Other Ambulatory Visit: Payer: Self-pay

## 2023-11-10 MED ORDER — SITAGLIPTIN PHOSPHATE 50 MG PO TABS: 50.0000 mg | ORAL_TABLET | Freq: Every day | ORAL | 6 refills | Status: DC

## 2023-11-14 ENCOUNTER — Other Ambulatory Visit: Payer: Self-pay

## 2023-11-15 ENCOUNTER — Ambulatory Visit: Payer: Medicare Other | Admitting: Internal Medicine

## 2023-12-26 ENCOUNTER — Other Ambulatory Visit: Payer: Self-pay | Admitting: Internal Medicine

## 2024-01-03 DIAGNOSIS — H5203 Hypermetropia, bilateral: Secondary | ICD-10-CM | POA: Diagnosis not present

## 2024-01-03 DIAGNOSIS — E119 Type 2 diabetes mellitus without complications: Secondary | ICD-10-CM | POA: Diagnosis not present

## 2024-01-03 DIAGNOSIS — H2513 Age-related nuclear cataract, bilateral: Secondary | ICD-10-CM | POA: Diagnosis not present

## 2024-02-02 DIAGNOSIS — H2511 Age-related nuclear cataract, right eye: Secondary | ICD-10-CM | POA: Diagnosis not present

## 2024-03-19 ENCOUNTER — Other Ambulatory Visit: Payer: Self-pay | Admitting: Internal Medicine

## 2024-03-19 ENCOUNTER — Other Ambulatory Visit: Payer: Self-pay

## 2024-03-19 MED ORDER — CITALOPRAM HYDROBROMIDE 10 MG PO TABS: 10.0000 mg | ORAL_TABLET | Freq: Every day | ORAL | 4 refills | Status: DC

## 2024-07-23 ENCOUNTER — Other Ambulatory Visit: Payer: Self-pay | Admitting: Internal Medicine

## 2024-07-30 ENCOUNTER — Other Ambulatory Visit: Payer: Self-pay | Admitting: Internal Medicine

## 2024-08-16 ENCOUNTER — Other Ambulatory Visit: Payer: Self-pay | Admitting: Internal Medicine

## 2024-09-18 ENCOUNTER — Other Ambulatory Visit: Payer: Self-pay | Admitting: Internal Medicine

## 2024-09-18 ENCOUNTER — Ambulatory Visit: Admitting: Internal Medicine

## 2024-09-18 ENCOUNTER — Encounter: Payer: Self-pay | Admitting: Internal Medicine

## 2024-09-18 VITALS — BP 116/70 | HR 89 | Temp 98.2°F | Resp 18 | Ht 68.0 in | Wt 116.4 lb

## 2024-09-18 DIAGNOSIS — E1122 Type 2 diabetes mellitus with diabetic chronic kidney disease: Secondary | ICD-10-CM

## 2024-09-18 DIAGNOSIS — I10 Essential (primary) hypertension: Secondary | ICD-10-CM

## 2024-09-18 DIAGNOSIS — F819 Developmental disorder of scholastic skills, unspecified: Secondary | ICD-10-CM | POA: Diagnosis not present

## 2024-09-18 DIAGNOSIS — E785 Hyperlipidemia, unspecified: Secondary | ICD-10-CM

## 2024-09-18 NOTE — Assessment & Plan Note (Signed)
 I will do lipid panel.  Her caregiver will bring all her medication on next visit.

## 2024-09-18 NOTE — Progress Notes (Signed)
   Office Visit  Subjective   Patient ID: Kim Adams   DOB: 01-17-1941   Age: 83 y.o.   MRN: [id number]   Chief Complaint Chief Complaint  Patient presents with   Follow-up    Follow up     History of Present Illness 83 years old is here for follow up. She get meal in her apartment. [employer], she is doing better.   She is still take Seroquel  50 mg twice a day.  She do not have any agitation or aggressive behavior.  She can bathe herself, dress herself.  She walks around with a walker.  She does not drive.     She has diabetes mellitus and she is due for her hemoglobin A1c today.  She has  Cataracts surgery in her eyes. She takes Januvia  50 mg, glimepiride  2 mg and Jardiance 10 mg daily.   She has dyslipidemia and take simvastatin  10 mg daily.  I will do lipid panel.   She also has hypertension and take enalapril  5 mg daily and Lasix  20 mg as needed.   She is looking pale and I will do CBC.  Past Medical History Past Medical History:  Diagnosis Date   Anxiety    Depression    Diabetes mellitus    Hypertension      Allergies Allergies  Allergen Reactions   Fosamax [Alendronate Sodium] Other (See Comments)    Esophageal problems     Review of Systems Review of Systems  Constitutional: Negative.   HENT: Negative.    Respiratory: Negative.    Cardiovascular: Negative.   Gastrointestinal: Negative.   Neurological: Negative.        Objective:    Vitals BP 116/70   Pulse 89   Temp 98.2 F (36.8 C)   Resp 18   Ht 5' 8 (1.727 m)   Wt 116 lb 6 oz (52.8 kg)   SpO2 98%   BMI 17.69 kg/m    Physical Examination Physical Exam Constitutional:      Appearance: Normal appearance.  Cardiovascular:     Rate and Rhythm: Normal rate and regular rhythm.     Heart sounds: Normal heart sounds.  Pulmonary:     Effort: Pulmonary effort is normal.     Breath sounds: Normal breath sounds.  Abdominal:     General: Bowel sounds are normal.     Palpations:  Abdomen is soft.  Neurological:     General: No focal deficit present.     Mental Status: She is alert and oriented to person, place, and time.        Assessment & Plan:   Primary hypertension   Her blood pressure is controlled.  Controlled diabetes mellitus with chronic kidney disease (HCC)   She does not check her sugar at home.  I will do hemoglobin A1c renal function and urine microalbuminuria.  She need to see eye doctor for diabetic eye exam.  Mental developmental delay   She is stable at her baseline  Dyslipidemia  I will do lipid panel.  Her caregiver will bring all her medication on next visit.    Return in about 1 month (around 10/18/2024).   Roetta Dare, MD

## 2024-09-18 NOTE — Assessment & Plan Note (Signed)
 She is stable at her baseline

## 2024-09-18 NOTE — Assessment & Plan Note (Signed)
 She does not check her sugar at home.  I will do hemoglobin A1c renal function and urine microalbuminuria.  She need to see eye doctor for diabetic eye exam.

## 2024-09-18 NOTE — Assessment & Plan Note (Signed)
 Her blood pressure is controlled.

## 2024-09-19 LAB — COMPREHENSIVE METABOLIC PANEL WITH GFR
ALT: 23 IU/L (ref 0–32)
AST: 35 IU/L (ref 0–40)
Albumin: 4 g/dL (ref 3.7–4.7)
Alkaline Phosphatase: 88 IU/L (ref 48–129)
BUN/Creatinine Ratio: 41 — ABNORMAL HIGH (ref 12–28)
BUN: 58 mg/dL — ABNORMAL HIGH (ref 8–27)
Bilirubin Total: <0.2 mg/dL (ref 0.0–1.2)
CO2: 20 mmol/L (ref 20–29)
Calcium: 9 mg/dL (ref 8.7–10.3)
Chloride: 105 mmol/L (ref 96–106)
Creatinine, Ser: 1.43 mg/dL — ABNORMAL HIGH (ref 0.57–1.00)
Globulin, Total: 1.9 g/dL (ref 1.5–4.5)
Glucose: 205 mg/dL — ABNORMAL HIGH (ref 70–99)
Potassium: 4.9 mmol/L (ref 3.5–5.2)
Sodium: 140 mmol/L (ref 134–144)
Total Protein: 5.9 g/dL — ABNORMAL LOW (ref 6.0–8.5)
eGFR: 36 mL/min/1.73 — ABNORMAL LOW (ref >59)

## 2024-09-19 LAB — LIPID PANEL W/O CHOL/HDL RATIO
Cholesterol, Total: 184 mg/dL (ref 100–199)
HDL: 56 mg/dL (ref >39)
LDL Chol Calc (NIH): 82 mg/dL (ref 0–99)
Triglycerides: 282 mg/dL — ABNORMAL HIGH (ref 0–149)
VLDL Cholesterol Cal: 46 mg/dL — ABNORMAL HIGH (ref 5–40)

## 2024-09-19 LAB — CBC WITH DIFFERENTIAL/PLATELET
Basophils Absolute: 0 x10E3/uL (ref 0.0–0.2)
Basos: 1 %
EOS (ABSOLUTE): 0.2 x10E3/uL (ref 0.0–0.4)
Eos: 4 %
Hematocrit: 32.4 % — ABNORMAL LOW (ref 34.0–46.6)
Hemoglobin: 10.6 g/dL — ABNORMAL LOW (ref 11.1–15.9)
Immature Grans (Abs): 0.1 x10E3/uL (ref 0.0–0.1)
Immature Granulocytes: 2 %
Lymphocytes Absolute: 1.2 x10E3/uL (ref 0.7–3.1)
Lymphs: 24 %
MCH: 30 pg (ref 26.6–33.0)
MCHC: 32.7 g/dL (ref 31.5–35.7)
MCV: 92 fL (ref 79–97)
Monocytes Absolute: 0.7 x10E3/uL (ref 0.1–0.9)
Monocytes: 14 %
Neutrophils Absolute: 2.8 x10E3/uL (ref 1.4–7.0)
Neutrophils: 55 %
Platelets: 231 x10E3/uL (ref 150–450)
RBC: 3.53 x10E6/uL — ABNORMAL LOW (ref 3.77–5.28)
RDW: 12.6 % (ref 11.7–15.4)
WBC: 5.1 x10E3/uL (ref 3.4–10.8)

## 2024-09-19 LAB — HGB A1C W/O EAG: Hgb A1c MFr Bld: 7 % — ABNORMAL HIGH (ref 4.8–5.6)

## 2024-09-20 ENCOUNTER — Other Ambulatory Visit: Payer: Self-pay | Admitting: Internal Medicine

## 2024-09-25 ENCOUNTER — Other Ambulatory Visit: Payer: Self-pay | Admitting: Internal Medicine

## 2024-09-26 ENCOUNTER — Ambulatory Visit: Payer: Self-pay

## 2024-09-26 LAB — MICROALBUMIN / CREATININE URINE RATIO
Creatinine, Urine: 71.4 mg/dL
Microalb/Creat Ratio: 35 mg/g{creat} — ABNORMAL HIGH (ref 0–29)
Microalbumin, Urine: 25.1 ug/mL

## 2024-09-26 LAB — SPECIMEN STATUS REPORT

## 2024-09-26 NOTE — Progress Notes (Signed)
 Patient called.  Patient aware. Her diabetes and cholestrol is well controlled  Spoke to [employer] sister.

## 2024-10-02 ENCOUNTER — Ambulatory Visit: Payer: Self-pay

## 2024-10-02 NOTE — Progress Notes (Signed)
 Patient called.  Patient aware. Urine is better   Dr caleen : urine is better per dr amin

## 2024-10-16 ENCOUNTER — Ambulatory Visit: Admitting: Internal Medicine
